# Patient Record
Sex: Male | Born: 1935 | Race: White | Hispanic: No | Smoking: Former smoker
Health system: Southern US, Community
[De-identification: ages and names within clinical notes are randomized; demographics above are authoritative.]

## PROBLEM LIST (undated history)

## (undated) DIAGNOSIS — I639 Cerebral infarction, unspecified: Secondary | ICD-10-CM

## (undated) DIAGNOSIS — F0390 Unspecified dementia without behavioral disturbance: Secondary | ICD-10-CM

## (undated) DIAGNOSIS — G20 Parkinson's disease: Secondary | ICD-10-CM

## (undated) DIAGNOSIS — I509 Heart failure, unspecified: Secondary | ICD-10-CM

---
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
## 1999-10-22 ENCOUNTER — Encounter: Payer: Self-pay | Admitting: Cardiology

## 1999-10-22 ENCOUNTER — Inpatient Hospital Stay (HOSPITAL_COMMUNITY): Admission: EM | Admit: 1999-10-22 | Discharge: 1999-10-23 | Payer: Self-pay | Admitting: Emergency Medicine

## 1999-11-14 ENCOUNTER — Ambulatory Visit (HOSPITAL_COMMUNITY): Admission: RE | Admit: 1999-11-14 | Discharge: 1999-11-14 | Payer: Self-pay | Admitting: Internal Medicine

## 2012-08-16 ENCOUNTER — Encounter: Payer: Self-pay | Admitting: Gastroenterology

## 2013-04-24 ENCOUNTER — Encounter: Payer: Self-pay | Admitting: Gastroenterology

## 2016-02-23 ENCOUNTER — Other Ambulatory Visit: Payer: Self-pay | Admitting: Neurological Surgery

## 2016-02-23 DIAGNOSIS — S12191A Other nondisplaced fracture of second cervical vertebra, initial encounter for closed fracture: Secondary | ICD-10-CM

## 2016-02-28 ENCOUNTER — Ambulatory Visit
Admission: RE | Admit: 2016-02-28 | Discharge: 2016-02-28 | Disposition: A | Discharge status: Home / Self Care | Payer: Medicare Other | Source: Ambulatory Visit | Attending: Neurological Surgery | Admitting: Neurological Surgery

## 2016-02-28 DIAGNOSIS — S12191A Other nondisplaced fracture of second cervical vertebra, initial encounter for closed fracture: Secondary | ICD-10-CM

## 2017-10-30 DIAGNOSIS — F0390 Unspecified dementia without behavioral disturbance: Secondary | ICD-10-CM

## 2017-10-30 DIAGNOSIS — I509 Heart failure, unspecified: Secondary | ICD-10-CM

## 2017-10-30 HISTORY — DX: Unspecified dementia, unspecified severity, without behavioral disturbance, psychotic disturbance, mood disturbance, and anxiety: F03.90

## 2017-10-30 HISTORY — DX: Heart failure, unspecified: I50.9

## 2018-09-05 DIAGNOSIS — I639 Cerebral infarction, unspecified: Secondary | ICD-10-CM | POA: Diagnosis present

## 2018-09-05 HISTORY — DX: Cerebral infarction, unspecified: I63.9

## 2019-03-27 ENCOUNTER — Emergency Department (HOSPITAL_COMMUNITY)
Admission: EM | Admit: 2019-03-27 | Discharge: 2019-03-28 | Disposition: A | Discharge status: Home / Self Care | Payer: Medicare Other | Attending: Emergency Medicine | Admitting: Emergency Medicine

## 2019-03-27 ENCOUNTER — Other Ambulatory Visit: Payer: Self-pay

## 2019-03-27 ENCOUNTER — Encounter (HOSPITAL_COMMUNITY): Payer: Self-pay | Admitting: Emergency Medicine

## 2019-03-27 DIAGNOSIS — F0390 Unspecified dementia without behavioral disturbance: Secondary | ICD-10-CM | POA: Insufficient documentation

## 2019-03-27 DIAGNOSIS — R059 Cough, unspecified: Secondary | ICD-10-CM

## 2019-03-27 DIAGNOSIS — R05 Cough: Secondary | ICD-10-CM

## 2019-03-27 DIAGNOSIS — Z87891 Personal history of nicotine dependence: Secondary | ICD-10-CM | POA: Insufficient documentation

## 2019-03-27 DIAGNOSIS — Z8673 Personal history of transient ischemic attack (TIA), and cerebral infarction without residual deficits: Secondary | ICD-10-CM | POA: Diagnosis not present

## 2019-03-27 DIAGNOSIS — Z20828 Contact with and (suspected) exposure to other viral communicable diseases: Secondary | ICD-10-CM | POA: Insufficient documentation

## 2019-03-27 DIAGNOSIS — R0600 Dyspnea, unspecified: Secondary | ICD-10-CM | POA: Insufficient documentation

## 2019-03-27 NOTE — ED Notes (Signed)
RN spoke with daughter at bedside. Daughter said nursing home has been giving pt breathing treatments lately for cough and SOB.

## 2019-03-27 NOTE — ED Triage Notes (Signed)
Pt presents to ED c/o cough that has worsened over the last few days, hot to touch. Pt is from [facility]. 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

## 2019-03-27 NOTE — ED Notes (Signed)
Frederick Decker, Frederick Decker, daughter, [phone]. Has insurance card and paperwork. And if possible call her if she needs to come back with him. She is here

## 2019-03-28 ENCOUNTER — Emergency Department (HOSPITAL_COMMUNITY): Payer: Medicare Other

## 2019-03-28 DIAGNOSIS — R05 Cough: Secondary | ICD-10-CM | POA: Diagnosis not present

## 2019-03-28 LAB — BASIC METABOLIC PANEL
Anion gap: 14 (ref 5–15)
BUN: 18 mg/dL (ref 8–23)
CO2: 18 mmol/L — ABNORMAL LOW (ref 22–32)
Calcium: 8.9 mg/dL (ref 8.9–10.3)
Chloride: 104 mmol/L (ref 98–111)
Creatinine, Ser: 1.5 mg/dL — ABNORMAL HIGH (ref 0.61–1.24)
GFR calc Af Amer: 49 mL/min — ABNORMAL LOW (ref >60)
GFR calc non Af Amer: 42 mL/min — ABNORMAL LOW (ref >60)
Glucose, Bld: 152 mg/dL — ABNORMAL HIGH (ref 70–99)
Potassium: 3.6 mmol/L (ref 3.5–5.1)
Sodium: 136 mmol/L (ref 135–145)

## 2019-03-28 LAB — CBC WITH DIFFERENTIAL/PLATELET
Abs Immature Granulocytes: 0.15 10*3/uL — ABNORMAL HIGH (ref 0.00–0.07)
Basophils Absolute: 0 10*3/uL (ref 0.0–0.1)
Basophils Relative: 0 %
Eosinophils Absolute: 0.2 10*3/uL (ref 0.0–0.5)
Eosinophils Relative: 2 %
HCT: 33.6 % — ABNORMAL LOW (ref 39.0–52.0)
Hemoglobin: 10.9 g/dL — ABNORMAL LOW (ref 13.0–17.0)
Immature Granulocytes: 2 %
Lymphocytes Relative: 15 %
Lymphs Abs: 1.5 10*3/uL (ref 0.7–4.0)
MCH: 29.2 pg (ref 26.0–34.0)
MCHC: 32.4 g/dL (ref 30.0–36.0)
MCV: 90.1 fL (ref 80.0–100.0)
Monocytes Absolute: 1.1 10*3/uL — ABNORMAL HIGH (ref 0.1–1.0)
Monocytes Relative: 10 %
Neutro Abs: 7.3 10*3/uL (ref 1.7–7.7)
Neutrophils Relative %: 71 %
Platelets: 182 10*3/uL (ref 150–400)
RBC: 3.73 MIL/uL — ABNORMAL LOW (ref 4.22–5.81)
RDW: 13.1 % (ref 11.5–15.5)
WBC: 10.3 10*3/uL (ref 4.0–10.5)
nRBC: 0 % (ref 0.0–0.2)

## 2019-03-28 LAB — BRAIN NATRIURETIC PEPTIDE: B Natriuretic Peptide: 101.9 pg/mL — ABNORMAL HIGH (ref 0.0–100.0)

## 2019-03-28 LAB — TROPONIN I: Troponin I: <0.03 ng/mL (ref <0.03)

## 2019-03-28 LAB — SARS CORONAVIRUS 2 BY RT PCR (HOSPITAL ORDER, PERFORMED IN ~~LOC~~ HOSPITAL LAB): SARS Coronavirus 2: NEGATIVE

## 2019-03-28 NOTE — ED Notes (Signed)
RN called [facility].  

## 2019-03-28 NOTE — ED Provider Notes (Signed)
TIME SEEN: 12:17 AM  CHIEF COMPLAINT: Cough  HPI: Patient is an 85 year old male with history of dementia, stroke with right-sided deficits and aphasia, CHF who presents to the emergency department from [facility] nursing home with concerns for cough and increased work of breathing tonight.  No known fevers.  Nursing home called patient's daughter who asked for them to call 911 to bring him to the hospital.  No known COVID outbreak at this nursing home facility.  Daughter reports increased bilateral lower extremity swelling 4 days.  ROS: Level 5 caveat secondary to dementia and aphasia  PAST MEDICAL HISTORY/PAST SURGICAL HISTORY:  Past Medical History:  Diagnosis Date  . CHF (congestive heart failure) (HCC) 2019  . Dementia (HCC) 2019  . Stroke [facility]) 09/05/2018    MEDICATIONS:  Prior to Admission medications   Not on File    ALLERGIES:  No Known Allergies  SOCIAL HISTORY:  Social History   Tobacco Use  . Smoking status: Former Games developer  . Smokeless tobacco: Never Used  Substance Use Topics  . Alcohol use: Never    Frequency: Never    FAMILY HISTORY: No family history on file.  EXAM: BP 116/62   Pulse 87   Temp 99.5 F (37.5 C) (Rectal)   Resp (!) 24   Ht 5\' 4"  (1.626 m)   Wt 79.4 kg   SpO2 96%   BMI 30.04 kg/m  CONSTITUTIONAL: Alert but unable to answer questions or follow commands.  Elderly, in no distress, afebrile HEAD: Normocephalic EYES: Conjunctivae clear, pupils appear equal, EOMI ENT: normal nose; moist mucous membranes NECK: Supple, no meningismus, no nuchal rigidity, no LAD  CARD: RRR; S1 and S2 appreciated; no murmurs, no clicks, no rubs, no gallops RESP: Normal chest excursion without splinting or tachypnea; breath sounds clear and equal bilaterally; no wheezes, no rhonchi, no rales, no hypoxia or respiratory distress, speaking full sentences ABD/GI: Normal bowel sounds; non-distended; soft, non-tender, no rebound, no guarding, no peritoneal signs, no  hepatosplenomegaly BACK:  The back appears normal and is non-tender to palpation, there is no CVA tenderness EXT: Normal ROM in all joints; non-tender to palpation; mild nonpitting edema in bilateral lower extremities to the mid shin; normal capillary refill; no cyanosis, no calf tenderness or swelling    SKIN: Normal color for age and race; warm; no rash NEURO: 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 Grooming and personal hygiene are appropriate.  MEDICAL DECISION MAKING: Patient here with cough and increased work of breathing at his nursing home tonight.  Afebrile here and no increased work of breathing, respiratory distress, hypoxia.  His lungs are clear.  Will obtain chest x-ray to evaluate for CHF exacerbation versus pneumonia.  Will check labs, EKG and a COVID-19 swab.  Daughter comfortable with this plan.  ED PROGRESS: Patient's work-up here has been unremarkable.  Coronavirus screening negative.  Chest x-ray clear.  No sign of pneumonia or pulmonary edema.  He has not coughing here in the emergency room and he has not had hypoxia or increased work of breathing.  I feel he is safe to go back to his nursing facility and his daughter who is at the bedside agrees.  She will have the physician at the nursing facility reassess the patient tomorrow.  Discussed return precautions.   At this time, I do not feel there is any life-threatening condition present. I have reviewed and discussed all results (EKG, imaging,  lab, urine as appropriate) and exam findings with patient/family. I have reviewed nursing notes and appropriate previous records.  I feel the patient is safe to be discharged home without further emergent workup and can continue workup as an outpatient as needed. Discussed usual and customary return precautions. Patient/family verbalize understanding and are comfortable with this plan.  Outpatient  follow-up has been provided as needed. All questions have been answered.      EKG Interpretation  Date/Time:  Friday Mar 28 2019 01:00:49 EDT Ventricular Rate:  85 PR Interval:    QRS Duration: 99 QT Interval:  382 QTC Calculation: 455 R Axis:   41 Text Interpretation:  Sinus rhythm No significant change since last tracing Confirmed by Ward, Baxter HireKristen [phone] on 03/28/2019 2:00:27 AM          Ward, Layla MawKristen N, DO 03/28/19 [id number]

## 2019-03-28 NOTE — ED Notes (Signed)
Discharge instructions discussed with pt. Pt's family verbalized understanding. Pt stable leaving with [facility].

## 2019-06-05 ENCOUNTER — Emergency Department (HOSPITAL_COMMUNITY): Payer: Medicare Other

## 2019-06-05 ENCOUNTER — Encounter (HOSPITAL_COMMUNITY): Payer: Self-pay | Admitting: Emergency Medicine

## 2019-06-05 ENCOUNTER — Inpatient Hospital Stay (HOSPITAL_COMMUNITY)
Admission: EM | Admit: 2019-06-05 | Discharge: 2019-06-12 | DRG: 177 | Disposition: A | Discharge status: Skilled Nursing Facility | Payer: Medicare Other | Source: Skilled Nursing Facility | Attending: Internal Medicine | Admitting: Internal Medicine

## 2019-06-05 DIAGNOSIS — K769 Liver disease, unspecified: Secondary | ICD-10-CM | POA: Diagnosis present

## 2019-06-05 DIAGNOSIS — R609 Edema, unspecified: Secondary | ICD-10-CM

## 2019-06-05 DIAGNOSIS — F0280 Dementia in other diseases classified elsewhere without behavioral disturbance: Secondary | ICD-10-CM | POA: Diagnosis not present

## 2019-06-05 DIAGNOSIS — N183 Chronic kidney disease, stage 3 unspecified: Secondary | ICD-10-CM | POA: Diagnosis present

## 2019-06-05 DIAGNOSIS — Z79899 Other long term (current) drug therapy: Secondary | ICD-10-CM

## 2019-06-05 DIAGNOSIS — R0602 Shortness of breath: Secondary | ICD-10-CM

## 2019-06-05 DIAGNOSIS — I10 Essential (primary) hypertension: Secondary | ICD-10-CM | POA: Diagnosis not present

## 2019-06-05 DIAGNOSIS — R7989 Other specified abnormal findings of blood chemistry: Secondary | ICD-10-CM | POA: Diagnosis not present

## 2019-06-05 DIAGNOSIS — Z7982 Long term (current) use of aspirin: Secondary | ICD-10-CM | POA: Diagnosis not present

## 2019-06-05 DIAGNOSIS — R0902 Hypoxemia: Secondary | ICD-10-CM

## 2019-06-05 DIAGNOSIS — L89896 Pressure-induced deep tissue damage of other site: Secondary | ICD-10-CM | POA: Diagnosis present

## 2019-06-05 DIAGNOSIS — R778 Other specified abnormalities of plasma proteins: Secondary | ICD-10-CM | POA: Diagnosis present

## 2019-06-05 DIAGNOSIS — Z20828 Contact with and (suspected) exposure to other viral communicable diseases: Secondary | ICD-10-CM | POA: Diagnosis present

## 2019-06-05 DIAGNOSIS — K219 Gastro-esophageal reflux disease without esophagitis: Secondary | ICD-10-CM | POA: Diagnosis not present

## 2019-06-05 DIAGNOSIS — I5033 Acute on chronic diastolic (congestive) heart failure: Secondary | ICD-10-CM | POA: Diagnosis not present

## 2019-06-05 DIAGNOSIS — Z7902 Long term (current) use of antithrombotics/antiplatelets: Secondary | ICD-10-CM

## 2019-06-05 DIAGNOSIS — G20 Parkinson's disease: Secondary | ICD-10-CM | POA: Diagnosis not present

## 2019-06-05 DIAGNOSIS — L8990 Pressure ulcer of unspecified site, unspecified stage: Secondary | ICD-10-CM | POA: Insufficient documentation

## 2019-06-05 DIAGNOSIS — E876 Hypokalemia: Secondary | ICD-10-CM | POA: Diagnosis present

## 2019-06-05 DIAGNOSIS — J9601 Acute respiratory failure with hypoxia: Secondary | ICD-10-CM | POA: Diagnosis present

## 2019-06-05 DIAGNOSIS — I639 Cerebral infarction, unspecified: Secondary | ICD-10-CM | POA: Diagnosis present

## 2019-06-05 DIAGNOSIS — R4182 Altered mental status, unspecified: Secondary | ICD-10-CM | POA: Diagnosis present

## 2019-06-05 DIAGNOSIS — I69351 Hemiplegia and hemiparesis following cerebral infarction affecting right dominant side: Secondary | ICD-10-CM

## 2019-06-05 DIAGNOSIS — R1312 Dysphagia, oropharyngeal phase: Secondary | ICD-10-CM | POA: Diagnosis not present

## 2019-06-05 DIAGNOSIS — Z87891 Personal history of nicotine dependence: Secondary | ICD-10-CM

## 2019-06-05 DIAGNOSIS — G20A1 Parkinson's disease without dyskinesia, without mention of fluctuations: Secondary | ICD-10-CM | POA: Diagnosis present

## 2019-06-05 DIAGNOSIS — I248 Other forms of acute ischemic heart disease: Secondary | ICD-10-CM | POA: Diagnosis not present

## 2019-06-05 DIAGNOSIS — J690 Pneumonitis due to inhalation of food and vomit: Secondary | ICD-10-CM | POA: Diagnosis not present

## 2019-06-05 DIAGNOSIS — I351 Nonrheumatic aortic (valve) insufficiency: Secondary | ICD-10-CM | POA: Diagnosis not present

## 2019-06-05 DIAGNOSIS — Z96653 Presence of artificial knee joint, bilateral: Secondary | ICD-10-CM | POA: Diagnosis present

## 2019-06-05 DIAGNOSIS — I130 Hypertensive heart and chronic kidney disease with heart failure and stage 1 through stage 4 chronic kidney disease, or unspecified chronic kidney disease: Secondary | ICD-10-CM | POA: Diagnosis not present

## 2019-06-05 DIAGNOSIS — T502X5A Adverse effect of carbonic-anhydrase inhibitors, benzothiadiazides and other diuretics, initial encounter: Secondary | ICD-10-CM | POA: Diagnosis not present

## 2019-06-05 DIAGNOSIS — F0390 Unspecified dementia without behavioral disturbance: Secondary | ICD-10-CM | POA: Diagnosis present

## 2019-06-05 HISTORY — DX: Parkinson's disease: G20

## 2019-06-05 LAB — LIPASE, BLOOD: Lipase: 41 U/L (ref 11–51)

## 2019-06-05 LAB — URINALYSIS, ROUTINE W REFLEX MICROSCOPIC
Bilirubin Urine: NEGATIVE
Glucose, UA: NEGATIVE mg/dL
Ketones, ur: NEGATIVE mg/dL
Leukocytes,Ua: NEGATIVE
Nitrite: NEGATIVE
Protein, ur: NEGATIVE mg/dL
Specific Gravity, Urine: 1.01 (ref 1.005–1.030)
pH: 6 (ref 5.0–8.0)

## 2019-06-05 LAB — COMPREHENSIVE METABOLIC PANEL
ALT: 7 U/L (ref 0–44)
AST: 20 U/L (ref 15–41)
Albumin: 3.1 g/dL — ABNORMAL LOW (ref 3.5–5.0)
Alkaline Phosphatase: 62 U/L (ref 38–126)
Anion gap: 8 (ref 5–15)
BUN: 18 mg/dL (ref 8–23)
CO2: 23 mmol/L (ref 22–32)
Calcium: 9 mg/dL (ref 8.9–10.3)
Chloride: 110 mmol/L (ref 98–111)
Creatinine, Ser: 1.3 mg/dL — ABNORMAL HIGH (ref 0.61–1.24)
GFR calc Af Amer: 58 mL/min — ABNORMAL LOW (ref >60)
GFR calc non Af Amer: 50 mL/min — ABNORMAL LOW (ref >60)
Glucose, Bld: 131 mg/dL — ABNORMAL HIGH (ref 70–99)
Potassium: 3.3 mmol/L — ABNORMAL LOW (ref 3.5–5.1)
Sodium: 141 mmol/L (ref 135–145)
Total Bilirubin: 0.6 mg/dL (ref 0.3–1.2)
Total Protein: 6.5 g/dL (ref 6.5–8.1)

## 2019-06-05 LAB — CBC WITH DIFFERENTIAL/PLATELET
Abs Immature Granulocytes: 0.08 10*3/uL — ABNORMAL HIGH (ref 0.00–0.07)
Basophils Absolute: 0 10*3/uL (ref 0.0–0.1)
Basophils Relative: 0 %
Eosinophils Absolute: 0.3 10*3/uL (ref 0.0–0.5)
Eosinophils Relative: 3 %
HCT: 36.3 % — ABNORMAL LOW (ref 39.0–52.0)
Hemoglobin: 11.9 g/dL — ABNORMAL LOW (ref 13.0–17.0)
Immature Granulocytes: 1 %
Lymphocytes Relative: 13 %
Lymphs Abs: 1.2 10*3/uL (ref 0.7–4.0)
MCH: 29.5 pg (ref 26.0–34.0)
MCHC: 32.8 g/dL (ref 30.0–36.0)
MCV: 90.1 fL (ref 80.0–100.0)
Monocytes Absolute: 0.8 10*3/uL (ref 0.1–1.0)
Monocytes Relative: 8 %
Neutro Abs: 7.2 10*3/uL (ref 1.7–7.7)
Neutrophils Relative %: 75 %
Platelets: 245 10*3/uL (ref 150–400)
RBC: 4.03 MIL/uL — ABNORMAL LOW (ref 4.22–5.81)
RDW: 13.2 % (ref 11.5–15.5)
WBC: 9.6 10*3/uL (ref 4.0–10.5)
nRBC: 0 % (ref 0.0–0.2)

## 2019-06-05 LAB — SARS CORONAVIRUS 2 BY RT PCR (HOSPITAL ORDER, PERFORMED IN ~~LOC~~ HOSPITAL LAB): SARS Coronavirus 2: NEGATIVE

## 2019-06-05 LAB — TROPONIN I (HIGH SENSITIVITY)
Troponin I (High Sensitivity): 22 ng/L — ABNORMAL HIGH (ref <18)
Troponin I (High Sensitivity): 21 ng/L — ABNORMAL HIGH (ref <18)
Troponin I (High Sensitivity): 21 ng/L — ABNORMAL HIGH (ref <18)

## 2019-06-05 LAB — BRAIN NATRIURETIC PEPTIDE: B Natriuretic Peptide: 110.5 pg/mL — ABNORMAL HIGH (ref 0.0–100.0)

## 2019-06-05 LAB — PROTIME-INR
INR: 1.1 (ref 0.8–1.2)
Prothrombin Time: 14.2 seconds (ref 11.4–15.2)

## 2019-06-05 LAB — LACTIC ACID, PLASMA
Lactic Acid, Venous: 1.5 mmol/L (ref 0.5–1.9)
Lactic Acid, Venous: 0.9 mmol/L (ref 0.5–1.9)

## 2019-06-05 LAB — AMMONIA: Ammonia: 20 umol/L (ref 9–35)

## 2019-06-05 MED ORDER — IPRATROPIUM BROMIDE 0.02 % IN SOLN
0.5000 mg | Freq: Four times a day (QID) | RESPIRATORY_TRACT | Status: DC
  Administered 2019-06-06 (×2): 0.5 mg via RESPIRATORY_TRACT
  Filled 2019-06-05 (×4): qty 2.5

## 2019-06-05 MED ORDER — POTASSIUM CHLORIDE CRYS ER 20 MEQ PO TBCR
40.0000 meq | EXTENDED_RELEASE_TABLET | Freq: Once | ORAL | Status: AC
  Administered 2019-06-06: 40 meq via ORAL
  Filled 2019-06-05: qty 2

## 2019-06-05 MED ORDER — DOXYCYCLINE HYCLATE 100 MG PO TABS
100.0000 mg | ORAL_TABLET | Freq: Two times a day (BID) | ORAL | Status: DC
  Administered 2019-06-06: 100 mg via ORAL
  Filled 2019-06-05: qty 1

## 2019-06-05 MED ORDER — IOHEXOL 350 MG/ML SOLN
75.0000 mL | Freq: Once | INTRAVENOUS | Status: AC | PRN
  Administered 2019-06-05: 19:00:00 75 mL via INTRAVENOUS

## 2019-06-05 MED ORDER — ENOXAPARIN SODIUM 40 MG/0.4ML ~~LOC~~ SOLN
40.0000 mg | SUBCUTANEOUS | Status: DC
  Administered 2019-06-06 – 2019-06-11 (×7): 40 mg via SUBCUTANEOUS
  Filled 2019-06-05 (×7): qty 0.4

## 2019-06-05 MED ORDER — FUROSEMIDE 10 MG/ML IJ SOLN
40.0000 mg | Freq: Two times a day (BID) | INTRAMUSCULAR | Status: DC
  Administered 2019-06-06 – 2019-06-08 (×5): 40 mg via INTRAVENOUS
  Filled 2019-06-05 (×6): qty 4

## 2019-06-05 MED ORDER — CARBIDOPA-LEVODOPA 25-100 MG PO TABS
1.0000 | ORAL_TABLET | Freq: Three times a day (TID) | ORAL | Status: DC
  Administered 2019-06-06 – 2019-06-12 (×20): 1 via ORAL
  Filled 2019-06-05 (×22): qty 1

## 2019-06-05 MED ORDER — CLOPIDOGREL BISULFATE 75 MG PO TABS
75.0000 mg | ORAL_TABLET | Freq: Every day | ORAL | Status: DC
  Administered 2019-06-06 – 2019-06-12 (×7): 75 mg via ORAL
  Filled 2019-06-05 (×7): qty 1

## 2019-06-05 MED ORDER — STARCH (THICKENING) PO POWD
1.0000 | Freq: Three times a day (TID) | ORAL | Status: DC
  Administered 2019-06-05 – 2019-06-08 (×6): 1 via ORAL
  Filled 2019-06-05: qty 227

## 2019-06-05 MED ORDER — PANTOPRAZOLE SODIUM 40 MG PO TBEC
40.0000 mg | DELAYED_RELEASE_TABLET | Freq: Every day | ORAL | Status: DC
  Administered 2019-06-06 – 2019-06-12 (×7): 40 mg via ORAL
  Filled 2019-06-05 (×7): qty 1

## 2019-06-05 MED ORDER — LORATADINE 10 MG PO TABS
10.0000 mg | ORAL_TABLET | Freq: Every day | ORAL | Status: DC
  Administered 2019-06-06 – 2019-06-12 (×8): 10 mg via ORAL
  Filled 2019-06-05 (×8): qty 1

## 2019-06-05 MED ORDER — BAZA PROTECT EX CREA: 1.0000 "application " | TOPICAL_CREAM | Freq: Two times a day (BID) | CUTANEOUS | Status: DC | PRN

## 2019-06-05 MED ORDER — FUROSEMIDE 10 MG/ML IJ SOLN
40.0000 mg | Freq: Once | INTRAMUSCULAR | Status: AC
  Administered 2019-06-05: 20:00:00 40 mg via INTRAVENOUS
  Filled 2019-06-05: qty 4

## 2019-06-05 MED ORDER — PIPERACILLIN-TAZOBACTAM 3.375 G IVPB
3.3750 g | Freq: Three times a day (TID) | INTRAVENOUS | Status: DC
  Administered 2019-06-06 – 2019-06-09 (×11): 3.375 g via INTRAVENOUS
  Filled 2019-06-05 (×12): qty 50

## 2019-06-05 MED ORDER — ALBUTEROL SULFATE (2.5 MG/3ML) 0.083% IN NEBU: 2.5000 mg | INHALATION_SOLUTION | RESPIRATORY_TRACT | Status: DC | PRN

## 2019-06-05 MED ORDER — VITAMIN D 25 MCG (1000 UNIT) PO TABS
1000.0000 [IU] | ORAL_TABLET | Freq: Every day | ORAL | Status: DC
  Administered 2019-06-06 – 2019-06-12 (×7): 1000 [IU] via ORAL
  Filled 2019-06-05 (×9): qty 1

## 2019-06-05 MED ORDER — FINASTERIDE 5 MG PO TABS
5.0000 mg | ORAL_TABLET | Freq: Every day | ORAL | Status: DC
  Administered 2019-06-06 – 2019-06-12 (×7): 5 mg via ORAL
  Filled 2019-06-05 (×7): qty 1

## 2019-06-05 MED ORDER — AMLODIPINE BESYLATE 10 MG PO TABS
10.0000 mg | ORAL_TABLET | Freq: Every day | ORAL | Status: DC
  Administered 2019-06-06 – 2019-06-12 (×7): 10 mg via ORAL
  Filled 2019-06-05 (×7): qty 1

## 2019-06-05 MED ORDER — DM-GUAIFENESIN ER 30-600 MG PO TB12
1.0000 | ORAL_TABLET | Freq: Two times a day (BID) | ORAL | Status: DC | PRN
  Administered 2019-06-11: 1 via ORAL
  Filled 2019-06-05: qty 1

## 2019-06-05 MED ORDER — ASPIRIN EC 81 MG PO TBEC
81.0000 mg | DELAYED_RELEASE_TABLET | Freq: Every day | ORAL | Status: DC
  Administered 2019-06-06 – 2019-06-12 (×7): 81 mg via ORAL
  Filled 2019-06-05 (×7): qty 1

## 2019-06-05 MED ORDER — TRAZODONE HCL 50 MG PO TABS
25.0000 mg | ORAL_TABLET | Freq: Every day | ORAL | Status: DC
  Administered 2019-06-06 – 2019-06-11 (×7): 25 mg via ORAL
  Filled 2019-06-05 (×7): qty 1

## 2019-06-05 NOTE — ED Notes (Signed)
Admitting provider at the bedside.

## 2019-06-05 NOTE — ED Provider Notes (Signed)
  Physical Exam  BP 134/89   Pulse 84   Temp 99 F (37.2 C) (Rectal)   Resp 13   SpO2 100%   Physical Exam  ED Course/Procedures     Procedures  MDM  Patient sent from nursing home with hypoxia.  Sats in the 80s there.  Sats have been better here however has a fair amount of edema on his legs and abdomen.  Facility cannot do oxygen.  I think the fact with sats went all the way down to the 80s patient benefit from admission to the hospital for diuresis and further evaluation.  BNP mildly elevated although x-ray did not show CHF.  Had been treated with doxycycline for pneumonia but no pneumonia on x-ray either.  Will admit for further work-up.  Will diurese here.  CTA done due to relative immobility and shortness of breath/hypoxia.  Does not show PE within limits of the somewhat degraded test but does show potential infiltrate in right midlung field.  Will admit to unassigned medicine for       Davonna Belling, MD 06/05/19 [place]

## 2019-06-05 NOTE — ED Triage Notes (Signed)
Pt here from nursing home with c/o ascites and swelling to legs new for him , pt was recently dx with PNA last sat and is currently on antibiotics  Pt with  history of stroke and normally can do yes and no questions but non verbal today

## 2019-06-05 NOTE — ED Notes (Signed)
Patient transported to CT 

## 2019-06-05 NOTE — Progress Notes (Signed)
Pharmacy Antibiotic Note  Frederick Decker is a 85 y.o. male admitted on 06/05/2019 with possible aspiration pneumonia. Was recently treated with doxycycline outpt for pneumonia. CXR show potential infiltrates in right midlung field. Pharmacy has been consulted for pip-tazo dosing.  Afebrile, Tmax 99. WBC wnl 9.6. Scr 1.3, near baseline.   Plan: Zosyn 3.375 gm IV q8h F/U cxs, clinical improvement, and renal fxn  Weight: 180 lb (81.6 kg)(Family reported)  Temp (24hrs), Avg:99 F (37.2 C), Min:99 F (37.2 C), Max:99 F (37.2 C)  Recent Labs  Lab 06/05/19 1210 06/05/19 1240 06/05/19 1410  WBC 9.6  --   --   CREATININE 1.30*  --   --   LATICACIDVEN  --  1.5 0.9    Estimated Creatinine Clearance: 40.1 mL/min (A) (by C-G formula based on SCr of 1.3 mg/dL (H)).    No Known Allergies  Antimicrobials this admission: 8/6 Zosyn >>  Microbiology results: 8/6 BCx: sent 8/6 UCx: sent  Thank you for allowing pharmacy to be a part of this patient's care.  Mirian Capuchin, 06/05/2019 8:36 PM

## 2019-06-05 NOTE — H&P (Signed)
History and Physical    Frederick AlbinoBill Devilla ZOX:[id number] DOB: December 25, 1930 DOA: 06/05/2019  Referring MD/NP/PA:   PCP: System, Pcp Not In   Patient coming from:  The patient is coming from SNF.  At baseline, pt is dependent for most of ADL.        Chief Complaint: SOB  HPI: Frederick Decker is a 85 y.o. male with medical history significant of dementia, hypertension, stroke with right-sided weakness, dCHF, Parkinson's disease, CKD stage III, GERD, who presents with shortness of breath.  Per patient's daughter, patient was noted to have shortness of breath and dry cough in the facility today.  He also has mild fever and chills.  He was found to have oxygen desaturation to upper 80s in facility.  Daughter does not think patient has any chest pain.  No active nausea, vomiting, diarrhea or abdominal pain.  No symptoms of UTI.  Does not seem to have abdominal pain per her daughter.  At baseline, patient is not oriented x3. He can only say simple words such as "yes", basically not talking.  Per her daughter, patient's mental status seems to be close to his baseline. Of note, pt has treated with doxycycline for possible pneumonia since 7/31. Pt has bilateral leg edema spreading up to abdomen.  ED Course: pt was found to have WBC 9.6, negative COVID-19 test, ammonia level 20, potassium 3.3, BMP 110, troponin XX 1, 22, INR 1.1, lactic acid 0.9, renal function close to baseline, temperature 99, blood pressure 141/83, heart rate 87, respiration rate 24, 15, current oxygen saturation 96% on room air, chest x-ray negative.  CT angiogram is a limited study, but did not show central PE, it also showed few areas of patchy ground-glass opacity in the right middle lobe, lingula and lower lobes with more consolidative opacity in the right posterior costophrenic sulcus. Pt is admitted to tele bed as inpt  Review of Systems: Could not be reviewed due to dementia  Allergy: No Known Allergies  Past Medical History:  Diagnosis  Date   CHF (congestive heart failure) (HCC) 2019   Dementia (HCC) 2019   Parkinson disease (HCC)    Stroke (HCC) 09/05/2018    Past Surgical History:  Procedure Laterality Date   BACK SURGERY     REPLACEMENT TOTAL KNEE BILATERAL      Social History:  reports that he has quit smoking. He has never used smokeless tobacco. He reports that he does not drink alcohol or use drugs.  Family History: No family history on file.   Prior to Admission medications   Medication Sig Start Date End Date Taking? Authorizing Provider  acetaminophen (TYLENOL) 325 MG tablet Take 650 mg by mouth every 6 (six) hours as needed.   Yes [provider]  albuterol (PROVENTIL) (2.5 MG/3ML) 0.083% nebulizer solution Inhale 2.5 mLs into the lungs every 4 (four) hours as needed. 02/22/19  Yes [provider]  amLODipine (NORVASC) 10 MG tablet Take 10 mg by mouth daily.   Yes [provider]  aspirin EC 81 MG tablet Take 81 mg by mouth daily.   Yes [provider]  bumetanide (BUMEX) 0.5 MG tablet Take 0.5 mg by mouth daily. 03/06/19  Yes [provider]  carbidopa-levodopa (SINEMET IR) 25-100 MG tablet Take 1 tablet by mouth 3 (three) times daily.   Yes [provider]  cetirizine (ZYRTEC) 10 MG tablet Take 10 mg by mouth daily.   Yes [provider]  Cholecalciferol (D 1000) 25 MCG (1000 UT)  capsule Take 1,000 Units by mouth daily.   Yes [provider]  clopidogrel (PLAVIX) 75 MG tablet Take 75 mg by mouth daily. 03/03/19  Yes [provider]  Cyanocobalamin (VITAMIN B 12 PO) Take 1,000 mcg by mouth daily.   Yes [provider]  doxycycline (VIBRA-TABS) 100 MG tablet Take 100 mg by mouth 2 (two) times daily.   Yes [provider]  Ensure (ENSURE) Take 237 mLs by mouth daily. Keep in the frig   Yes [provider]  finasteride (PROSCAR) 5 MG tablet Take 5 mg by mouth daily. 03/03/19  Yes [provider]  food thickener (THICK IT) POWD Take 1 Container by mouth 3 (three) times daily. With liquids   Yes [provider]  omeprazole (PRILOSEC) 20 MG capsule Take 20 mg by mouth daily. 03/21/19  Yes [provider]  potassium chloride (K-DUR) 10 MEQ tablet Take 10 mEq by mouth 2 (two) times daily.   Yes [provider]  Skin Protectants, Misc. (DIMETHICONE-ZINC OXIDE) cream Apply 1 application topically 2 (two) times daily as needed (skin Incontinence care).   Yes [provider]  traZODone (DESYREL) 50 MG tablet Take 25 mg by mouth at bedtime. 03/03/19  Yes [provider]    Physical Exam: Vitals:   06/05/19 1730 06/05/19 1900 06/05/19 2000 06/05/19 2129  BP: 134/89 (!) 141/83  129/82  Pulse: 84 87  74  Resp:  15  17  Temp:    98.2 F (36.8 C)  TempSrc:    Oral  SpO2: 100% 99%  95%  Weight:   81.6 kg    General: Not in acute distress HEENT:       Eyes: PERRL, EOMI, no scleral icterus.       ENT: No discharge from the ears and nose, no pharynx injection, no tonsillar enlargement.        Neck: positive JVD, no bruit, no mass felt. Heme: No neck lymph node enlargement. Cardiac: S1/S2, RRR, No murmurs, No gallops or rubs. Respiratory: has fine crackles at base and rhonchi bilaterally. GI: Soft, nondistended, nontender, no rebound pain, no organomegaly, BS present. Has abdominal wall swelling GU: No hematuria Ext: 3+ pitting leg edema bilaterally. 2+DP/PT pulse bilaterally. Musculoskeletal: No joint deformities, No joint redness or warmth, no limitation of ROM in spin. Skin: No rashes.  Neuro: Nonverbal, not oriented X3, cranial nerves II-XII grossly intact, has right sided weakness from previous stroke. t. Psych: Patient is not psychotic, no suicidal or hemocidal ideation.  Labs on Admission: I have personally reviewed following labs and imaging studies  CBC: Recent Labs  Lab 06/05/19 1210  WBC 9.6  NEUTROABS 7.2  HGB 11.9*  HCT  36.3*  MCV 90.1  PLT 245   Basic Metabolic Panel: Recent Labs  Lab 06/05/19 1210  NA 141  K 3.3*  CL 110  CO2 23  GLUCOSE 131*  BUN 18  CREATININE 1.30*  CALCIUM 9.0   GFR: Estimated Creatinine Clearance: 40.1 mL/min (A) (by C-G formula based on SCr of 1.3 mg/dL (H)). Liver Function Tests: Recent Labs  Lab 06/05/19 1210  AST 20  ALT 7  ALKPHOS 62  BILITOT 0.6  PROT 6.5  ALBUMIN 3.1*   Recent Labs  Lab 06/05/19 1210  LIPASE 41   Recent Labs  Lab 06/05/19 1240  AMMONIA 20   Coagulation Profile: Recent Labs  Lab 06/05/19 1210  INR 1.1   Cardiac Enzymes: No results for input(s): CKTOTAL, CKMB, CKMBINDEX, TROPONINI  in the last 168 hours. BNP (last 3 results) No results for input(s): PROBNP in the last 8760 hours. HbA1C: No results for input(s): HGBA1C in the last 72 hours. CBG: No results for input(s): GLUCAP in the last 168 hours. Lipid Profile: No results for input(s): CHOL, HDL, LDLCALC, TRIG, CHOLHDL, LDLDIRECT in the last 72 hours. Thyroid Function Tests: No results for input(s): TSH, T4TOTAL, FREET4, T3FREE, THYROIDAB in the last 72 hours. Anemia Panel: No results for input(s): VITAMINB12, FOLATE, FERRITIN, TIBC, IRON, RETICCTPCT in the last 72 hours. Urine analysis:    Component Value Date/Time   COLORURINE STRAW (A) 06/05/2019 1410   APPEARANCEUR HAZY (A) 06/05/2019 1410   LABSPEC 1.010 06/05/2019 1410   PHURINE 6.0 06/05/2019 1410   GLUCOSEU NEGATIVE 06/05/2019 1410   HGBUR SMALL (A) 06/05/2019 1410   BILIRUBINUR NEGATIVE 06/05/2019 1410   KETONESUR NEGATIVE 06/05/2019 1410   PROTEINUR NEGATIVE 06/05/2019 1410   NITRITE NEGATIVE 06/05/2019 1410   LEUKOCYTESUR NEGATIVE 06/05/2019 1410   Sepsis Labs: @LABRCNTIP (procalcitonin:4,lacticidven:4) ) Recent Results (from the past 240 hour(s))  SARS Coronavirus 2 [facility] order, Performed in [facility] hospital lab) Nasopharyngeal Nasopharyngeal Swab     Status: None   Collection Time:  06/05/19 12:30 PM   Specimen: Nasopharyngeal Swab  Result Value Ref Range Status   SARS Coronavirus 2 NEGATIVE NEGATIVE Final    Comment: (NOTE) If result is NEGATIVE SARS-CoV-2 target nucleic acids are NOT DETECTED. The SARS-CoV-2 RNA is generally detectable in upper and lower  respiratory specimens during the acute phase of infection. The lowest  concentration of SARS-CoV-2 viral copies this assay can detect is 250  copies / mL. A negative result does not preclude SARS-CoV-2 infection  and should not be used as the sole basis for treatment or other  patient management decisions.  A negative result may occur with  improper specimen collection / handling, submission of specimen other  than nasopharyngeal swab, presence of viral mutation(s) within the  areas targeted by this assay, and inadequate number of viral copies  (<250 copies / mL). A negative result must be combined with clinical  observations, patient history, and epidemiological information. If result is POSITIVE SARS-CoV-2 target nucleic acids are DETECTED. The SARS-CoV-2 RNA is generally detectable in upper and lower  respiratory specimens dur ing the acute phase of infection.  Positive  results are indicative of active infection with SARS-CoV-2.  Clinical  correlation with patient history and other diagnostic information is  necessary to determine patient infection status.  Positive results do  not rule out bacterial infection or co-infection with other viruses. If result is PRESUMPTIVE POSTIVE SARS-CoV-2 nucleic acids MAY BE PRESENT.   A presumptive positive result was obtained on the submitted specimen  and confirmed on repeat testing.  While 2019 novel coronavirus  (SARS-CoV-2) nucleic acids may be present in the submitted sample  additional confirmatory testing may be necessary for epidemiological  and / or clinical management purposes  to differentiate between  SARS-CoV-2 and other Sarbecovirus currently known to  infect humans.  If clinically indicated additional testing with an alternate test  methodology [phone]) is advised. The SARS-CoV-2 RNA is generally  detectable in upper and lower respiratory sp ecimens during the acute  phase of infection. The expected result is Negative. Fact Sheet for Patients:  BoilerBrush.com.cy Fact Sheet for Healthcare Providers: https://pope.com/ This test is not yet approved or cleared by the [place] FDA and has been authorized for detection and/or diagnosis of SARS-CoV-2 by FDA under an Emergency  Use Authorization (EUA).  This EUA will remain in effect (meaning this test can be used) for the duration of the COVID-19 declaration under Section 564(b)(1) of the Act, 21 U.S.C. section 360bbb-3(b)(1), unless the authorization is terminated or revoked sooner. Performed at [facility], [address] [place], [place] [address]      Radiological Exams on Admission: Ct Head Wo Contrast  Result Date: 06/05/2019 CLINICAL DATA:  Altered mental status. EXAM: CT HEAD WITHOUT CONTRAST TECHNIQUE: Contiguous axial images were obtained from the base of the skull through the vertex without intravenous contrast. COMPARISON:  Head CT and MRI 09/05/2018 FINDINGS: Brain: There is no evidence of acute large territory infarct, intracranial hemorrhage, mass, midline shift, or extra-axial fluid collection. Confluent cerebral white matter hypodensities are similar to the prior CT and nonspecific but compatible with severe chronic small vessel ischemic disease. Moderate ventriculomegaly is unchanged and is again noted to be out of proportion to the degree of sulcal enlargement with gyral crowding at the vertex. A chronic distal left ACA infarct is noted in the posterior left frontal lobe. There are chronic lacunar infarcts in the thalami. Heterogeneous hypoattenuation is also present in the basal ganglia bilaterally with numerous  dilated perivascular spaces shown on MRI. There is a small chronic left cerebellar infarct which is unchanged. Vascular: Calcified atherosclerosis at the skull base. No hyperdense vessel. Skull: No acute fracture or focal osseous lesion. Sinuses/Orbits: No acute findings.  Bilateral cataract extraction. Other: None. IMPRESSION: 1. No evidence of acute intracranial abnormality. 2. Severe chronic small vessel ischemic disease with multiple chronic infarcts as above. 3. Unchanged ventriculomegaly which may reflect central predominant cerebral atrophy or normal pressure hydrocephalus in the appropriate clinical setting. Electronically Signed   By: Sebastian AcheAllen  Grady M.D.   On: 06/05/2019 15:33   Ct Angio Chest Pe W And/or Wo Contrast  Result Date: 06/05/2019 CLINICAL DATA:  PE suspected, high pretest probability, presents with ascites and swelling to legs, new for this patient. Recent pneumonia currently receiving antibiotic therapy. EXAM: CT ANGIOGRAPHY CHEST WITH CONTRAST TECHNIQUE: Multidetector CT imaging of the chest was performed using the standard protocol during bolus administration of intravenous contrast. Multiplanar CT image reconstructions and MIPs were obtained to evaluate the vascular anatomy. CONTRAST:  75mL OMNIPAQUE IOHEXOL 350 MG/ML SOLN COMPARISON:  Chest radiograph same day FINDINGS: Cardiovascular: Satisfactory opacification of the pulmonary arteries to the segmental level. Extensive respiratory motion artifact may limit detection of distal segmental and subsegmental pulmonary emboli. Detection is further complicated by extensive streak artifact from patient's thoracic hardware. No evidence of large central or segmental pulmonary embolus. The heart is enlarged with extensive coronary artery calcification, mitral annular calcification and calcifications upon the aortic leaflets. No pericardial effusion. Mediastinum/Nodes: No enlarged mediastinal, hilar, or axillary lymph nodes. Thyroid gland, trachea,  and esophagus demonstrate no significant findings. Lungs/Pleura: Dependent atelectasis is seen posteriorly. Few areas of patchy ground-glass opacity are present in the right middle lobe, lingula and lung bases which may reflect infectious or inflammatory sequela from known pneumonia. More consolidative opacity is present in the right posterior costophrenic sulcus. There is abundant subpleural fat. No pneumothorax. No visible effusion. Upper Abdomen: 2.2 cm hypoattenuating lesion is seen in segment 4 of the liver, incompletely assessed on this exam due to the single phase of contrast extensive streak artifact from the patient's thoracolumbar hardware. No acute abnormalities present in the visualized portions of the upper abdomen. Musculoskeletal: Cervicothoracic and thoracolumbar fusion hardware is present within the spine there is evidence  of adjacent segment disease near the terminus of both fixations. Findings on a background of more diffuse degenerative disease. Mild bilateral gynecomastia is present. No acute or suspicious osseous lesion or soft tissue abnormality is seen. Review of the MIP images confirms the above findings. IMPRESSION: 1. Extensive respiratory motion artifact may limit detection of distal segmental and subsegmental pulmonary emboli. No evidence of large central or segmental pulmonary embolus. 2. Few areas of patchy ground-glass opacity are present in the right middle lobe, lingula and lower lobes with more consolidative opacity in the right posterior costophrenic sulcus may reflect infectious or inflammatory sequela from known pneumonia and/or features of atelectasis. 3. Cardiomegaly with extensive coronary artery calcification, mitral annular calcification, and calcifications upon the aortic leaflets. 4. Aortic Atherosclerosis (ICD10-I70.0). 5. Indeterminate 2.2 cm hypoattenuating lesion in liver. Incompletely characterized given streak artifact in single phase of imaging. Could be further  evaluated with sonography on an outpatient basis. Electronically Signed   By: Kreg Shropshire M.D.   On: 06/05/2019 19:14   Dg Chest Portable 1 View  Result Date: 06/05/2019 CLINICAL DATA:  Shortness of breath, hypoxia EXAM: PORTABLE CHEST 1 VIEW COMPARISON:  None. FINDINGS: No consolidation, features of edema, pneumothorax, or effusion. Chronic elevation the right hemidiaphragm pulmonary vascularity is normally distributed. Calcified tortuous aorta the cardiomediastinal contours are otherwise unremarkable. No acute osseous or soft tissue abnormality. Degenerative changes are present in the and imaged spine and shoulders. Partially imaged cervical and thoracolumbar fusion hardware without visible complication. Surgical material projects in the right upper quadrant. IMPRESSION: No acute cardiopulmonary abnormality Electronically Signed   By: Kreg Shropshire M.D.   On: 06/05/2019 17:15     EKG: Independently reviewed.  Sinus rhythm, QTC 443, low voltage (poor quality of EKG strip)  Assessment/Plan Principal Problem:   Acute respiratory failure with hypoxia (HCC) Active Problems:   Acute on chronic diastolic CHF (congestive heart failure) (HCC)   Parkinson disease (HCC)   Stroke (HCC)   Dementia (HCC)   Aspiration pneumonia (HCC)   HTN (hypertension)   GERD (gastroesophageal reflux disease)   Hypokalemia   Liver lesion   Elevated troponin   CKD (chronic kidney disease), stage III (HCC)   Acute respiratory failure with hypoxia (HCC): Likely due to the combination of CHF exacerbation and possible aspiration pneumonia.  Patient has 3+ leg edema, spreading to abdomen, positive JVD, clinically consistent with CHF exacerbation.  Patient is currently treated with doxycycline for possible pneumonia.  CT angiogram is negative for central PE, but showed few areas of patchy ground-glass opacity in the right middle lobe, lingula and lower lobes with more consolidative opacity in the right posterior costophrenic  sulcus.  Given history of Parkinson's disease, is likely that the patient has aspiration pneumonia.  -will admit to telemetry bed as inpatient -cannula oxygen to maintain oxygen saturation above 93% -Bronchodilators -Treat aspiration pneumonia and CHF as below  Aspiration pneumonia: - keep pt NPO and get SLP - Start Zosyn and continue doxycycline - Mucinex for cough  - Atrovent and prn albuterol - Urine legionella and S. pneumococcal antigen - Follow up blood culture x2, sputum culture.  Acute on chronic diastolic CHF (congestive heart failure) (HCC): 2D echo 09/06/2018 showed EF 60-65%. -Lasix 40 mg bid by IV -2d echo -Daily weights -strict I/O's -Low salt diet -Fluid restriction  Elevated troponin: Trop 22. No CP.  Likely due to demand ischemia secondary to hypoxia and CHF exacerbation --Trend Trop - Repeat EKG in the am  - aspirin -  Risk factor stratification: will check FLP and A1C   Parkinson disease ([place]): -Continue Sinemet  Stroke [facility]): -Continue aspirin, Plavix  Dementia ([place]): No behavior change. -Observe mental status closely  Essential hypertension: -IV Hydralazine prn -Continue home medications: Amlodipine  GERD (gastroesophageal reflux disease): -Protonix  Hypokalemia: -Repleted -Check magnesium level  Liver lesion: CAT showed  Indeterminate 2.2 cm hypoattenuating lesion in liver. Incompletely characterized given streak artifact in single phase of imaging. -need to f/u with PCP  CKD (chronic kidney disease), stage III ([place]): Stable.  Recent baseline creatinine 1.50 on 03/27/2019.  His creatinine is 1.3, BUN 18. Follow-up renal function by [facility]        Inpatient status:  # Patient requires inpatient status due to high intensity of service, high risk for further deterioration and high frequency of surveillance required.  I certify that at the point of admission it is my clinical judgment that the patient will require inpatient hospital care  spanning beyond 2 midnights from the point of admission.   This patient has multiple chronic comorbidities including  dementia, hypertension, stroke with right-sided weakness, dCHF, Parkinson's disease, CKD stage III, GERD  Now patient has presenting with acute respiratory failure with hypoxia due to combination of CHF exacerbation and possible aspiration pneumonia.  The worrisome physical exam findings include fine crackles at base and rhonchi bilaterally on auscultation, 3+ leg edema, positive JVD,  The initial radiographic and laboratory data are worrisome because of right-sided lung infiltration by CT angiogram, elevated troponin, hypokalemia  Current medical needs: please see my assessment and plan  Predictability of an adverse outcome (risk): Patient's multiple comorbidities, now presents with acute respiratory failure with hypoxia due to combination of CHF exacerbation and possible aspiration pneumonia.  His presentation is highly complicated.  Given his old age and multiple comorbidities, patient is at high risk for deteriorating.  Will need to be treated in the hospital for at least 2 days.     DVT ppx:  SQ Lovenox Code Status: Partial code (I discussed with her daughter, and explained the meaning of CODE STATUS, patient wants to be partial code per her daugher, OK for CPR, but no intubation). Family Communication: Yes, patient's daughter at bed side Disposition Plan:  Anticipate discharge back to previous SNF Consults called:  none Admission status:  Inpatient/tele   Date of Service 06/06/2019    [place] [facility]   If 7PM-7AM, please contact night-coverage [email or web address] Password [email or web address] 06/06/2019, 12:47 AM

## 2019-06-05 NOTE — ED Provider Notes (Signed)
[facility] Provider Note   CSN: [id number] Arrival date & time: 06/05/19  1206     History   Chief Complaint No chief complaint on file.   HPI Frederick AlbinoBill Pellman is a 85 y.o. male.     LVL 5 caveat for Dementia, prior stroke, and AMS  The history is provided by medical records and the EMS personnel. The history is limited by the condition of the patient. No language interpreter was used.  Illness Location:  Peripheral edema Severity:  Severe Onset quality:  Gradual Duration:  2 days Timing:  Constant Progression:  Unchanged Chronicity:  New   Past Medical History:  Diagnosis Date  . CHF (congestive heart failure) (HCC) 2019  . Dementia (HCC) 2019  . Stroke (HCC) 09/05/2018    There are no active problems to display for this patient.   Past Surgical History:  Procedure Laterality Date  . BACK SURGERY    . REPLACEMENT TOTAL KNEE BILATERAL          Home Medications    Prior to Admission medications   Not on File    Family History No family history on file.  Social History Social History   Tobacco Use  . Smoking status: Former Games developermoker  . Smokeless tobacco: Never Used  Substance Use Topics  . Alcohol use: Never    Frequency: Never  . Drug use: Never     Allergies   Patient has no known allergies.   Review of Systems Review of Systems  Unable to perform ROS: Dementia     Physical Exam Updated Vital Signs There were no vitals taken for this visit.  Physical Exam Vitals signs and nursing note reviewed.  Constitutional:      General: He is not in acute distress.    Appearance: He is not ill-appearing, toxic-appearing or diaphoretic.  HENT:     Head: Normocephalic.     Nose: No congestion or rhinorrhea.     Mouth/Throat:     Mouth: Mucous membranes are moist.     Pharynx: No oropharyngeal exudate or posterior oropharyngeal erythema.  Eyes:     Extraocular Movements: Extraocular movements intact.    Conjunctiva/sclera: Conjunctivae normal.     Pupils: Pupils are equal, round, and reactive to light.  Neck:     Musculoskeletal: No muscular tenderness.  Cardiovascular:     Rate and Rhythm: Normal rate.     Pulses: Normal pulses.  Pulmonary:     Effort: Pulmonary effort is normal. No respiratory distress.     Breath sounds: No stridor. Rhonchi and rales present. No wheezing.  Chest:     Chest wall: No tenderness.  Abdominal:     General: Abdomen is flat. There is distension.     Tenderness: There is no abdominal tenderness.  Musculoskeletal:        General: No tenderness.     Right lower leg: Edema present.     Left lower leg: Edema present.  Skin:    General: Skin is warm.     Capillary Refill: Capillary refill takes less than 2 seconds.     Findings: No rash.  Neurological:     Mental Status: Mental status is at baseline.     Comments: Nonverbal  Not following commands.       ED Treatments / Results  Labs (all labs ordered are listed, but only abnormal results are displayed) Labs Reviewed  CBC WITH DIFFERENTIAL/PLATELET - Abnormal; Notable for the following components:  Result Value   RBC 4.03 (*)    Hemoglobin 11.9 (*)    HCT 36.3 (*)    Abs Immature Granulocytes 0.08 (*)    All other components within normal limits  COMPREHENSIVE METABOLIC PANEL - Abnormal; Notable for the following components:   Potassium 3.3 (*)    Glucose, Bld 131 (*)    Creatinine, Ser 1.30 (*)    Albumin 3.1 (*)    GFR calc non Af Amer 50 (*)    GFR calc Af Amer 58 (*)    All other components within normal limits  BRAIN NATRIURETIC PEPTIDE - Abnormal; Notable for the following components:   B Natriuretic Peptide 110.5 (*)    All other components within normal limits  URINALYSIS, ROUTINE W REFLEX MICROSCOPIC - Abnormal; Notable for the following components:   Color, Urine STRAW (*)    APPearance HAZY (*)    Hgb urine dipstick SMALL (*)    Bacteria, UA RARE (*)    All other  components within normal limits  TROPONIN I (HIGH SENSITIVITY) - Abnormal; Notable for the following components:   Troponin I (High Sensitivity) 22 (*)    All other components within normal limits  TROPONIN I (HIGH SENSITIVITY) - Abnormal; Notable for the following components:   Troponin I (High Sensitivity) 21 (*)    All other components within normal limits  SARS CORONAVIRUS 2 (HOSPITAL ORDER, [place] [facility])  CULTURE, BLOOD (ROUTINE X 2)  CULTURE, BLOOD (ROUTINE X 2)  URINE CULTURE  LIPASE, BLOOD  LACTIC ACID, PLASMA  LACTIC ACID, PLASMA  PROTIME-INR  AMMONIA    EKG EKG Interpretation  Date/Time:  Thursday June 05 2019 12:14:33 EDT Ventricular Rate:  76 PR Interval:    QRS Duration: 94 QT Interval:  394 QTC Calculation: 443 R Axis:   11 Text Interpretation:  Sinus rhythm Inferior infarct, age indeterminate Baseline wander in lead(s) V2 When compared to prior, more artifact.  No STEMI Confirmed by Antony Blackbird [phone]) on 06/05/2019 12:35:13 PM   Radiology Ct Head Wo Contrast  Result Date: 06/05/2019 CLINICAL DATA:  Altered mental status. EXAM: CT HEAD WITHOUT CONTRAST TECHNIQUE: Contiguous axial images were obtained from the base of the skull through the vertex without intravenous contrast. COMPARISON:  Head CT and MRI 09/05/2018 FINDINGS: Brain: There is no evidence of acute large territory infarct, intracranial hemorrhage, mass, midline shift, or extra-axial fluid collection. Confluent cerebral white matter hypodensities are similar to the prior CT and nonspecific but compatible with severe chronic small vessel ischemic disease. Moderate ventriculomegaly is unchanged and is again noted to be out of proportion to the degree of sulcal enlargement with gyral crowding at the vertex. A chronic distal left ACA infarct is noted in the posterior left frontal lobe. There are chronic lacunar infarcts in the thalami. Heterogeneous hypoattenuation is also present in  the basal ganglia bilaterally with numerous dilated perivascular spaces shown on MRI. There is a small chronic left cerebellar infarct which is unchanged. Vascular: Calcified atherosclerosis at the skull base. No hyperdense vessel. Skull: No acute fracture or focal osseous lesion. Sinuses/Orbits: No acute findings.  Bilateral cataract extraction. Other: None. IMPRESSION: 1. No evidence of acute intracranial abnormality. 2. Severe chronic small vessel ischemic disease with multiple chronic infarcts as above. 3. Unchanged ventriculomegaly which may reflect central predominant cerebral atrophy or normal pressure hydrocephalus in the appropriate clinical setting. Electronically Signed   By: Logan Bores M.D.   On: 06/05/2019 15:33    Procedures  Procedures (including critical care time)  Medications Ordered in ED Medications - No data to display   Initial Impression / Assessment and Plan / ED Course  I have reviewed the triage vital signs and the nursing notes.  Pertinent labs & imaging results that were available during my care of the patient were reviewed by me and considered in my medical decision making (see chart for details).        Frederick AlbinoBill Lutes is a 85 y.o. male with a past medical history significant for dementia, prior stroke, documentation for congestive heart failure, and recent diagnosis of pneumonia on doxycycline who presents with worsening peripheral edema, abdominal distention, decreased mental status, decreased appetite.  According to EMS, patient is at his baseline mental status currently but has had times where he is less interactive.  Patient is not answering questions on my initial evaluation.  Patient reportedly has never had significant peripheral or abdominal swelling and over the last 2 days has developed this rapidly.  Patient was diagnosed with pneumonia several days ago and started on antibiotics and is currently on doxycycline.  He is on room air on arrival.  On exam,  patient has rhonchi and rales in his lungs bilaterally.  Chest is nontender.  Abdomen is distended and nontender.  Legs are extremely edematous bilaterally.  Patient has pulses in all extremities.  Patient is unable to follow commands or speak to me.  According to EMS, patient uses able to do one-word answers at times due to his dementia and stroke.  Clinical concern patient has developed worsened heart failure or kidney failure with the marketed peripheral edema.  Patient will have work-up to look for organ failure as well as chest x-ray for worse pneumonia.  Will check urine.  We will also get a CT of the head given his reported decreased mental status in the setting of prior stroke.  Anticipate reassessment after work-up.   3:25 PM Daughter was able to provide further information.  Daughter reports that patient was having difficulty breathing this morning and oxygen saturations in the 80s.  As he does not use oxygen at his facility, patient was sent in for evaluation.  Patient's troponin was elevated but unchanging.  BNP was similar to prior but elevated.  Given the patient's body habitus and profound edema on exam, I am concerned that this value may be artificially low.  I am concerned about fluid overload in the setting of his pneumonia as well.  Lactic acid was not elevated, doubt sepsis at this time.  Urinalysis does not show convincing evidence of infection.  After CT head and chest x-ray are completed, patient will likely require admission due to the worsening fluid overload and current pneumonia causing hypoxia.  Care transferred to oncoming team while awaiting results of diagnostic work-up.  Anticipate admission.   Final Clinical Impressions(s) / ED Diagnoses   Final diagnoses:  Shortness of breath  Hypoxia  Peripheral edema     Clinical Impression: 1. Shortness of breath   2. Hypoxia   3. Peripheral edema     Disposition: Awaiting results of chest x-ray and CT head,  anticipate admission for fluid overload, creased work of breathing, and hypoxia.  This note was prepared with assistance of Conservation officer, historic buildingsDragon voice recognition software. Occasional wrong-word or sound-a-like substitutions may have occurred due to the inherent limitations of voice recognition software.      , Canary Brimhristopher J, MD 06/05/19 [phone]

## 2019-06-05 NOTE — ED Notes (Signed)
ED TO INPATIENT HANDOFF REPORT  ED Nurse Name and Phone #: [phone]  S Name/Age/Gender Frederick Decker 85 y.o. male Room/Bed: RESUSC/RESUSC  Code Status   Code Status: Not on file  Home/SNF/Other Skilled nursing facility Patient oriented to: self Is this baseline? Yes   Triage Complete: Triage complete  Chief Complaint Pneumonia  Triage Note Pt here from nursing home with c/o ascites and swelling to legs new for him , pt was recently dx with PNA last sat and is currently on antibiotics  Pt with  history of stroke and normally can do yes and no questions but non verbal today    Allergies No Known Allergies  Level of Care/Admitting Diagnosis ED Disposition    ED Disposition Condition Comment   Admit  [facility] [[id number]]  Level of Care: Telemetry Medical [104]  Covid Evaluation: Confirmed COVID Negative  Diagnosis: Aspiration pneumonia [facility](HCC) [[id number]]) [[id number]]  Admitting Physician: Lorretta HarpNIU, XILIN [[id number]]  Attending Physician: Lorretta HarpNIU, XILIN [phone][[id number]]  Estimated length of stay: past midnight tomorrow  Certification:: I certify this patient will need inpatient services for at least 2 midnights  PT Class (Do Not Modify): Inpatient [101]  PT Acc Code (Do Not Modify): Private [1]       B Medical/Surgery History Past Medical History:  Diagnosis Date  . CHF (congestive heart failure) (HCC) 2019  . Dementia (HCC) 2019  . Parkinson disease (HCC)   . Stroke [facility](HCC) 09/05/2018   Past Surgical History:  Procedure Laterality Date  . BACK SURGERY    . REPLACEMENT TOTAL KNEE BILATERAL       A IV Location/Drains/Wounds Patient Lines/Drains/Airways Status   Active Line/Drains/Airways    Name:   Placement date:   Placement time:   Site:   Days:   Peripheral IV 06/05/19 Left Forearm   06/05/19    1212    Forearm   less than 1          Intake/Output Last 24 hours No intake or output data in the 24 hours ending 06/05/19 2053  Labs/Imaging Results for orders  placed or performed during the hospital encounter of 06/05/19 (from the past 48 hour(s))  CBC with Differential     Status: Abnormal   Collection Time: 06/05/19 12:10 PM  Result Value Ref Range   WBC 9.6 4.0 - 10.5 K/uL   RBC 4.03 (L) 4.22 - 5.81 MIL/uL   Hemoglobin 11.9 (L) 13.0 - 17.0 g/dL   HCT 47.836.3 (L) 29.539.0 - 62.152.0 %   MCV 90.1 80.0 - 100.0 fL   MCH 29.5 26.0 - 34.0 pg   MCHC 32.8 30.0 - 36.0 g/dL   RDW 30.813.2 65.711.5 - 84.615.5 %   Platelets 245 150 - 400 K/uL   nRBC 0.0 0.0 - 0.2 %   Neutrophils Relative % 75 %   Neutro Abs 7.2 1.7 - 7.7 K/uL   Lymphocytes Relative 13 %   Lymphs Abs 1.2 0.7 - 4.0 K/uL   Monocytes Relative 8 %   Monocytes Absolute 0.8 0.1 - 1.0 K/uL   Eosinophils Relative 3 %   Eosinophils Absolute 0.3 0.0 - 0.5 K/uL   Basophils Relative 0 %   Basophils Absolute 0.0 0.0 - 0.1 K/uL   Immature Granulocytes 1 %   Abs Immature Granulocytes 0.08 (H) 0.00 - 0.07 K/uL    Comment: Performed at [facility], [address] [place], [place] [address]  Comprehensive metabolic panel     Status: Abnormal   Collection  Time: 06/05/19 12:10 PM  Result Value Ref Range   Sodium 141 135 - 145 mmol/L   Potassium 3.3 (L) 3.5 - 5.1 mmol/L   Chloride 110 98 - 111 mmol/L   CO2 23 22 - 32 mmol/L   Glucose, Bld 131 (H) 70 - 99 mg/dL   BUN 18 8 - 23 mg/dL   Creatinine, Ser 1.61 (H) 0.61 - 1.24 mg/dL   Calcium 9.0 8.9 - 09.6 mg/dL   Total Protein 6.5 6.5 - 8.1 g/dL   Albumin 3.1 (L) 3.5 - 5.0 g/dL   AST 20 15 - 41 U/L   ALT 7 0 - 44 U/L   Alkaline Phosphatase 62 38 - 126 U/L   Total Bilirubin 0.6 0.3 - 1.2 mg/dL   GFR calc non Af Amer 50 (L) >60 mL/min   GFR calc Af Amer 58 (L) >60 mL/min   Anion gap 8 5 - 15    Comment: Performed at [facility], [address] [place], [place] [address]  Lipase, blood     Status: None   Collection Time: 06/05/19 12:10 PM  Result Value Ref Range   Lipase 41 11 - 51 U/L    Comment: Performed at [facility], [address], [place], [place] [address]  Protime-INR     Status: None   Collection Time: 06/05/19 12:10 PM  Result Value Ref Range   Prothrombin Time 14.2 11.4 - 15.2 seconds   INR 1.1 0.8 - 1.2    Comment: (NOTE) INR goal varies based on device and disease states. Performed at [facility], [address], [place], [place] [address]   Troponin I (High Sensitivity)     Status: Abnormal   Collection Time: 06/05/19 12:10 PM  Result Value Ref Range   Troponin I (High Sensitivity) 22 (H) <18 ng/L    Comment: (NOTE) Elevated high sensitivity troponin I (hsTnI) values and significant  changes across serial measurements may suggest ACS but many other  chronic and acute conditions are known to elevate hsTnI results.  Refer to the "Links" section for chest pain algorithms and additional  guidance. Performed at [facility], [address], [place], [place] [address]   SARS Coronavirus 2 [facility] order, Performed in [facility] hospital lab) Nasopharyngeal Nasopharyngeal Swab     Status: None   Collection Time: 06/05/19 12:30 PM   Specimen: Nasopharyngeal Swab  Result Value Ref Range   SARS Coronavirus 2 NEGATIVE NEGATIVE    Comment: (NOTE) If result is NEGATIVE SARS-CoV-2 target nucleic acids are NOT DETECTED. The SARS-CoV-2 RNA is generally detectable in upper and lower  respiratory specimens during the acute phase of infection. The lowest  concentration of SARS-CoV-2 viral copies this assay can detect is 250  copies / mL. A negative result does not preclude SARS-CoV-2 infection  and should not be used as the sole basis for treatment or other  patient management decisions.  A negative result may occur with  improper specimen collection / handling, submission of specimen other  than nasopharyngeal swab, presence of viral mutation(s) within the  areas targeted by this assay, and inadequate number of viral copies  (<250 copies / mL). A negative result must be combined with clinical   observations, patient history, and epidemiological information. If result is POSITIVE SARS-CoV-2 target nucleic acids are DETECTED. The SARS-CoV-2 RNA is generally detectable in upper and lower  respiratory specimens dur ing the acute phase of infection.  Positive  results are indicative of active infection with SARS-CoV-2.  Clinical  correlation with patient history and other diagnostic information is  necessary to determine patient infection status.  Positive results do  not rule out bacterial infection or co-infection with other viruses. If result is PRESUMPTIVE POSTIVE SARS-CoV-2 nucleic acids MAY BE PRESENT.   A presumptive positive result was obtained on the submitted specimen  and confirmed on repeat testing.  While 2019 novel coronavirus  (SARS-CoV-2) nucleic acids may be present in the submitted sample  additional confirmatory testing may be necessary for epidemiological  and / or clinical management purposes  to differentiate between  SARS-CoV-2 and other Sarbecovirus currently known to infect humans.  If clinically indicated additional testing with an alternate test  methodology [phone]([id number]) is advised. The SARS-CoV-2 RNA is generally  detectable in upper and lower respiratory sp ecimens during the acute  phase of infection. The expected result is Negative. Fact Sheet for Patients:  BoilerBrush.com.cyhttps://www.fda.gov/media/136312/download Fact Sheet for Healthcare Providers: https://pope.com/https://www.fda.gov/media/136313/download This test is not yet approved or cleared by the [place] FDA and has been authorized for detection and/or diagnosis of SARS-CoV-2 by FDA under an Emergency Use Authorization (EUA).  This EUA will remain in effect (meaning this test can be used) for the duration of the COVID-19 declaration under Section 564(b)(1) of the Act, 21 U.S.C. section 360bbb-3(b)(1), unless the authorization is terminated or revoked sooner. Performed at [facility], [address]  [place], [place] [address]   Lactic acid, plasma     Status: None   Collection Time: 06/05/19 12:40 PM  Result Value Ref Range   Lactic Acid, Venous 1.5 0.5 - 1.9 mmol/L    Comment: Performed at [facility], [address] [place], [place] [address]  Ammonia     Status: None   Collection Time: 06/05/19 12:40 PM  Result Value Ref Range   Ammonia 20 9 - 35 umol/L    Comment: Performed at [facility], [address] [place], [place] [address]  Brain natriuretic peptide     Status: Abnormal   Collection Time: 06/05/19 12:40 PM  Result Value Ref Range   B Natriuretic Peptide 110.5 (H) 0.0 - 100.0 pg/mL    Comment: Performed at [facility], [address] [place], [place] [address]  Lactic acid, plasma     Status: None   Collection Time: 06/05/19  2:10 PM  Result Value Ref Range   Lactic Acid, Venous 0.9 0.5 - 1.9 mmol/L    Comment: Performed at [facility], [address] [place], [place] [address]  Urinalysis, Routine w reflex microscopic     Status: Abnormal   Collection Time: 06/05/19  2:10 PM  Result Value Ref Range   Color, Urine STRAW (A) YELLOW   APPearance HAZY (A) CLEAR   Specific Gravity, Urine 1.010 1.005 - 1.030   pH 6.0 5.0 - 8.0   Glucose, UA NEGATIVE NEGATIVE mg/dL   Hgb urine dipstick SMALL (A) NEGATIVE   Bilirubin Urine NEGATIVE NEGATIVE   Ketones, ur NEGATIVE NEGATIVE mg/dL   Protein, ur NEGATIVE NEGATIVE mg/dL   Nitrite NEGATIVE NEGATIVE   Leukocytes,Ua NEGATIVE NEGATIVE   RBC / HPF 11-20 0 - 5 RBC/hpf   WBC, UA 0-5 0 - 5 WBC/hpf   Bacteria, UA RARE (A) NONE SEEN   Squamous Epithelial / LPF 0-5 0 - 5   Mucus PRESENT    Hyaline Casts, UA PRESENT     Comment:  Performed at [facility], [address] [place], [place] [address]  Troponin I (High Sensitivity)     Status: Abnormal   Collection Time: 06/05/19  2:10 PM  Result Value Ref Range   Troponin I (High Sensitivity) 21 (H) <18 ng/L    Comment: (NOTE) Elevated high  sensitivity troponin I (hsTnI) values and significant  changes across serial measurements may suggest ACS but many other  chronic and acute conditions are known to elevate hsTnI results.  Refer to the "Links" section for chest pain algorithms and additional  guidance. Performed at [facility], [address] [place], [place] [address]    Ct Head Wo Contrast  Result Date: 06/05/2019 CLINICAL DATA:  Altered mental status. EXAM: CT HEAD WITHOUT CONTRAST TECHNIQUE: Contiguous axial images were obtained from the base of the skull through the vertex without intravenous contrast. COMPARISON:  Head CT and MRI 09/05/2018 FINDINGS: Brain: There is no evidence of acute large territory infarct, intracranial hemorrhage, mass, midline shift, or extra-axial fluid collection. Confluent cerebral white matter hypodensities are similar to the prior CT and nonspecific but compatible with severe chronic small vessel ischemic disease. Moderate ventriculomegaly is unchanged and is again noted to be out of proportion to the degree of sulcal enlargement with gyral crowding at the vertex. A chronic distal left ACA infarct is noted in the posterior left frontal lobe. There are chronic lacunar infarcts in the thalami. Heterogeneous hypoattenuation is also present in the basal ganglia bilaterally with numerous dilated perivascular spaces shown on MRI. There is a small chronic left cerebellar infarct which is unchanged. Vascular: Calcified atherosclerosis at the skull base. No hyperdense vessel. Skull: No acute fracture or focal osseous lesion. Sinuses/Orbits: No acute findings.  Bilateral cataract extraction. Other: None. IMPRESSION: 1. No evidence of acute intracranial abnormality. 2. Severe chronic small vessel ischemic disease with multiple chronic infarcts as above. 3. Unchanged ventriculomegaly which may reflect central predominant cerebral atrophy or normal pressure hydrocephalus in the appropriate clinical setting.  Electronically Signed   By: Sebastian AcheAllen  Grady M.D.   On: 06/05/2019 15:33   Ct Angio Chest Pe W And/or Wo Contrast  Result Date: 06/05/2019 CLINICAL DATA:  PE suspected, high pretest probability, presents with ascites and swelling to legs, new for this patient. Recent pneumonia currently receiving antibiotic therapy. EXAM: CT ANGIOGRAPHY CHEST WITH CONTRAST TECHNIQUE: Multidetector CT imaging of the chest was performed using the standard protocol during bolus administration of intravenous contrast. Multiplanar CT image reconstructions and MIPs were obtained to evaluate the vascular anatomy. CONTRAST:  75mL OMNIPAQUE IOHEXOL 350 MG/ML SOLN COMPARISON:  Chest radiograph same day FINDINGS: Cardiovascular: Satisfactory opacification of the pulmonary arteries to the segmental level. Extensive respiratory motion artifact may limit detection of distal segmental and subsegmental pulmonary emboli. Detection is further complicated by extensive streak artifact from patient's thoracic hardware. No evidence of large central or segmental pulmonary embolus. The heart is enlarged with extensive coronary artery calcification, mitral annular calcification and calcifications upon the aortic leaflets. No pericardial effusion. Mediastinum/Nodes: No enlarged mediastinal, hilar, or axillary lymph nodes. Thyroid gland, trachea, and esophagus demonstrate no significant findings. Lungs/Pleura: Dependent atelectasis is seen posteriorly. Few areas of patchy ground-glass opacity are present in the right middle lobe, lingula and lung bases which may reflect infectious or inflammatory sequela from known pneumonia. More consolidative opacity is present in the right posterior costophrenic sulcus. There is abundant subpleural fat. No pneumothorax. No visible effusion. Upper Abdomen: 2.2 cm hypoattenuating lesion is seen in segment  4 of the liver, incompletely assessed on this exam due to the single phase of contrast extensive streak artifact from the  patient's thoracolumbar hardware. No acute abnormalities present in the visualized portions of the upper abdomen. Musculoskeletal: Cervicothoracic and thoracolumbar fusion hardware is present within the spine there is evidence of adjacent segment disease near the terminus of both fixations. Findings on a background of more diffuse degenerative disease. Mild bilateral gynecomastia is present. No acute or suspicious osseous lesion or soft tissue abnormality is seen. Review of the MIP images confirms the above findings. IMPRESSION: 1. Extensive respiratory motion artifact may limit detection of distal segmental and subsegmental pulmonary emboli. No evidence of large central or segmental pulmonary embolus. 2. Few areas of patchy ground-glass opacity are present in the right middle lobe, lingula and lower lobes with more consolidative opacity in the right posterior costophrenic sulcus may reflect infectious or inflammatory sequela from known pneumonia and/or features of atelectasis. 3. Cardiomegaly with extensive coronary artery calcification, mitral annular calcification, and calcifications upon the aortic leaflets. 4. Aortic Atherosclerosis (ICD10-I70.0). 5. Indeterminate 2.2 cm hypoattenuating lesion in liver. Incompletely characterized given streak artifact in single phase of imaging. Could be further evaluated with sonography on an outpatient basis. Electronically Signed   By: Lovena Le M.D.   On: 06/05/2019 19:14   Dg Chest Portable 1 View  Result Date: 06/05/2019 CLINICAL DATA:  Shortness of breath, hypoxia EXAM: PORTABLE CHEST 1 VIEW COMPARISON:  None. FINDINGS: No consolidation, features of edema, pneumothorax, or effusion. Chronic elevation the right hemidiaphragm pulmonary vascularity is normally distributed. Calcified tortuous aorta the cardiomediastinal contours are otherwise unremarkable. No acute osseous or soft tissue abnormality. Degenerative changes are present in the and imaged spine and  shoulders. Partially imaged cervical and thoracolumbar fusion hardware without visible complication. Surgical material projects in the right upper quadrant. IMPRESSION: No acute cardiopulmonary abnormality Electronically Signed   By: Lovena Le M.D.   On: 06/05/2019 17:15    Pending Labs Unresulted Labs (From admission, onward)    Start     Ordered   06/06/19 0500  Creatinine, serum  Daily,   R     06/05/19 2043   06/05/19 1210  Blood culture (routine x 2)  BLOOD CULTURE X 2,   STAT     06/05/19 1211   06/05/19 1210  Urine culture  ONCE - STAT,   STAT     06/05/19 1211   Signed and Held  Magnesium  Tomorrow morning,   R     Signed and Held   Signed and Held  Hemoglobin A1c  Tomorrow morning,   R     Signed and Held   Signed and Held  Lipid panel  Tomorrow morning,   R    Comments: Please obtain as a fasting lipid panel - should not have eaten/ drank food for 8 hours prior to labs.    Signed and Held   Signed and Held  Legionella Pneumophila Serogp 1 Ur Ag  Once,   R     Signed and Held   Signed and Held  Strep pneumoniae urinary antigen  Once,   R     Signed and Held          Vitals/Pain Today's Vitals   06/05/19 1730 06/05/19 1900 06/05/19 1949 06/05/19 2000  BP: 134/89 (!) 141/83    Pulse: 84 87    Resp:  15    Temp:      TempSrc:  SpO2: 100% 99%    Weight:    81.6 kg  PainSc:   0-No pain     Isolation Precautions No active isolations  Medications Medications  piperacillin-tazobactam (ZOSYN) IVPB 3.375 g (has no administration in time range)  iohexol (OMNIPAQUE) 350 MG/ML injection 75 mL (75 mLs Intravenous Contrast Given 06/05/19 1845)  furosemide (LASIX) injection 40 mg (40 mg Intravenous Given 06/05/19 1941)    Mobility manual wheelchair High fall risk   Focused Assessments Pulmonary Assessment Handoff:  Lung sounds:   O2 Device: Room Air        R Recommendations: See Admitting Provider Note  Report given to:   Additional Notes: pt lives  at [place], [place], pt daughter says that he does speak sometimes, but confused, baseline

## 2019-06-06 ENCOUNTER — Inpatient Hospital Stay (HOSPITAL_COMMUNITY): Payer: Medicare Other

## 2019-06-06 ENCOUNTER — Other Ambulatory Visit: Payer: Self-pay

## 2019-06-06 DIAGNOSIS — I351 Nonrheumatic aortic (valve) insufficiency: Secondary | ICD-10-CM

## 2019-06-06 DIAGNOSIS — L8990 Pressure ulcer of unspecified site, unspecified stage: Secondary | ICD-10-CM | POA: Insufficient documentation

## 2019-06-06 LAB — ECHOCARDIOGRAM COMPLETE: Weight: 2880 oz

## 2019-06-06 LAB — STREP PNEUMONIAE URINARY ANTIGEN: Strep Pneumo Urinary Antigen: NEGATIVE

## 2019-06-06 LAB — CREATININE, SERUM
Creatinine, Ser: 1.28 mg/dL — ABNORMAL HIGH (ref 0.61–1.24)
GFR calc Af Amer: 59 mL/min — ABNORMAL LOW (ref >60)
GFR calc non Af Amer: 51 mL/min — ABNORMAL LOW (ref >60)

## 2019-06-06 LAB — MRSA PCR SCREENING: MRSA by PCR: NEGATIVE

## 2019-06-06 LAB — URINE CULTURE

## 2019-06-06 LAB — MAGNESIUM: Magnesium: 1.8 mg/dL (ref 1.7–2.4)

## 2019-06-06 LAB — LIPID PANEL
Cholesterol: 119 mg/dL (ref 0–200)
HDL: 33 mg/dL — ABNORMAL LOW (ref >40)
LDL Cholesterol: 70 mg/dL (ref 0–99)
Total CHOL/HDL Ratio: 3.6 RATIO
Triglycerides: 78 mg/dL (ref <150)
VLDL: 16 mg/dL (ref 0–40)

## 2019-06-06 LAB — HEMOGLOBIN A1C
Hgb A1c MFr Bld: 6.1 % — ABNORMAL HIGH (ref 4.8–5.6)
Mean Plasma Glucose: 128.37 mg/dL

## 2019-06-06 LAB — TROPONIN I (HIGH SENSITIVITY): Troponin I (High Sensitivity): 19 ng/L — ABNORMAL HIGH (ref <18)

## 2019-06-06 MED ORDER — HALOPERIDOL LACTATE 5 MG/ML IJ SOLN: 2.0000 mg | Freq: Four times a day (QID) | INTRAMUSCULAR | Status: DC | PRN

## 2019-06-06 MED ORDER — IPRATROPIUM BROMIDE 0.02 % IN SOLN
0.5000 mg | Freq: Two times a day (BID) | RESPIRATORY_TRACT | Status: DC
  Administered 2019-06-07 – 2019-06-08 (×4): 0.5 mg via RESPIRATORY_TRACT
  Filled 2019-06-06 (×4): qty 2.5

## 2019-06-06 NOTE — Progress Notes (Signed)
PROGRESS NOTE    Frederick Decker  WRU:[id number] DOB: 1930/11/23 DOA: 06/05/2019 PCP: System, Pcp Not In  Brief Narrative:HPI: Frederick Decker is a 85 y.o. male with medical history history of dementia, stroke with right-sided weakness, Parkinson's disease, diastolic CHF, stage III chronic kidney disease presented to the emergency room with shortness of breath cough. -Patient at baseline requires assistance in most ADLs, is not able to ambulate without assistance, says only a few words like yes or no. -Family reports that he has gone downhill since his stroke in November. -Now admitted with aspiration pneumonia and acute on chronic diastolic CHF   Assessment & Plan:   Acute respiratory failure with hypoxia ([place]): -Secondary to aspiration pneumonia and acute on chronic diastolic CHF -Continue Zosyn, discontinue doxycycline -IV Lasix, see below -Follow-up blood cultures -SLP evaluation  Aspiration pneumonia -Suspect he has dysphagia from Parkinson's disease, advanced age, prior CVA -Check SLP evaluation today -Continue IV Zosyn -Follow-up blood cultures -Continue supportive care  Acute on chronic diastolic CHF (congestive heart failure) ([place]): - 2D echo 09/06/2018 showed EF 60-65%. -Continue IV Lasix 40 mg twice daily -Repeat echo ordered will follow-up -Monitor I/os, daily weights  Elevated troponin: Trop 22.  Likely due to demand ischemia secondary to hypoxia and CHF exacerbation -No further work-up indicated for this, patient denies any chest pain  Parkinson disease ([place]): -Continue Sinemet  Stroke [facility]): -Continue aspirin, Plavix  Dementia ([place]):  -At baseline can say yes or no and occasionally a sentence, intermittent confusion at baseline -Requires assistance in most ADLs  Essential hypertension: -IV Hydralazine prn -Continue home medications: Amlodipine  GERD (gastroesophageal reflux disease): -Protonix  Hypokalemia: -Repleted -Check magnesium level  Liver  lesion: CAT showed  Indeterminate 2.2 cm hypoattenuating lesion in liver. Incompletely characterized given streak artifact in single phase of imaging. -need to f/u with PCP  CKD (chronic kidney disease), stage III ([place]): Stable.  Recent baseline creatinine 1.50 on 03/27/2019.  His creatinine is 1.3, BUN 18. Follow-up renal function by BMP   DVT ppx:  SQ Lovenox Code Status:  Family request :partial code, wants CPR but no intubation, strongly recommended DNR, she daughter will discuss with other siblings Family Communication: Yes, patient's daughter at bed side Disposition Plan:   May need SNF Consults called:  none  Consultants:      Procedures:   Antimicrobials:    Subjective: -Pleasant, laying in bed, no distress, intermittent cough  Objective: Vitals:   06/05/19 2000 06/05/19 2129 06/06/19 0030 06/06/19 0744  BP:  129/82 131/80 139/71  Pulse:  74 72 68  Resp:  17 16 16   Temp:  98.2 F (36.8 C) 98.4 F (36.9 C) 98.2 F (36.8 C)  TempSrc:  Oral Oral Oral  SpO2:  95% 94% 96%  Weight: 81.6 kg       Intake/Output Summary (Last 24 hours) at 06/06/2019 0941 Last data filed at 06/06/2019 0520 Gross per 24 hour  Intake 0 ml  Output 1500 ml  Net -1500 ml   Filed Weights   06/05/19 2000  Weight: 81.6 kg    Examination:   General exam: Awake, unable to assess orientation, appears somewhat alert, did not communicate, no distress HEENT: Pupils reactive, oral mucosa moist Lungs: Rhonchi at right base, decreased breath sounds at both bases Cardiovascular system: S1 & S2 heard, RRR.  Gastrointestinal system: Abdomen is nondistended, soft and nontender.Normal bowel sounds heard. Central nervous system: Right hemiplegia, awake, unable to assess orientation Extremities: 1-2+ edema Psychiatry: Poor insight and judgment  Data Reviewed:   CBC: Recent Labs  Lab 06/05/19 1210  WBC 9.6  NEUTROABS 7.2  HGB 11.9*  HCT 36.3*  MCV 90.1  PLT 245   Basic Metabolic  Panel: Recent Labs  Lab 06/05/19 1210 06/06/19 0212  NA 141  --   K 3.3*  --   CL 110  --   CO2 23  --   GLUCOSE 131*  --   BUN 18  --   CREATININE 1.30* 1.28*  CALCIUM 9.0  --   MG  --  1.8   GFR: Estimated Creatinine Clearance: 40.7 mL/min (A) (by C-G formula based on SCr of 1.28 mg/dL (H)). Liver Function Tests: Recent Labs  Lab 06/05/19 1210  AST 20  ALT 7  ALKPHOS 62  BILITOT 0.6  PROT 6.5  ALBUMIN 3.1*   Recent Labs  Lab 06/05/19 1210  LIPASE 41   Recent Labs  Lab 06/05/19 1240  AMMONIA 20   Coagulation Profile: Recent Labs  Lab 06/05/19 1210  INR 1.1   Cardiac Enzymes: No results for input(s): CKTOTAL, CKMB, CKMBINDEX, TROPONINI in the last 168 hours. BNP (last 3 results) No results for input(s): PROBNP in the last 8760 hours. HbA1C: Recent Labs    06/06/19 0212  HGBA1C 6.1*   CBG: No results for input(s): GLUCAP in the last 168 hours. Lipid Profile: Recent Labs    06/06/19 0212  CHOL 119  HDL 33*  LDLCALC 70  TRIG 78  CHOLHDL 3.6   Thyroid Function Tests: No results for input(s): TSH, T4TOTAL, FREET4, T3FREE, THYROIDAB in the last 72 hours. Anemia Panel: No results for input(s): VITAMINB12, FOLATE, FERRITIN, TIBC, IRON, RETICCTPCT in the last 72 hours. Urine analysis:    Component Value Date/Time   COLORURINE STRAW (A) 06/05/2019 1410   APPEARANCEUR HAZY (A) 06/05/2019 1410   LABSPEC 1.010 06/05/2019 1410   PHURINE 6.0 06/05/2019 1410   GLUCOSEU NEGATIVE 06/05/2019 1410   HGBUR SMALL (A) 06/05/2019 1410   BILIRUBINUR NEGATIVE 06/05/2019 1410   KETONESUR NEGATIVE 06/05/2019 1410   PROTEINUR NEGATIVE 06/05/2019 1410   NITRITE NEGATIVE 06/05/2019 1410   LEUKOCYTESUR NEGATIVE 06/05/2019 1410   Sepsis Labs: @LABRCNTIP (procalcitonin:4,lacticidven:4)  ) Recent Results (from the past 240 hour(s))  SARS Coronavirus 2 [facility] order, Performed in [facility] hospital lab) Nasopharyngeal Nasopharyngeal Swab     Status: None    Collection Time: 06/05/19 12:30 PM   Specimen: Nasopharyngeal Swab  Result Value Ref Range Status   SARS Coronavirus 2 NEGATIVE NEGATIVE Final    Comment: (NOTE) If result is NEGATIVE SARS-CoV-2 target nucleic acids are NOT DETECTED. The SARS-CoV-2 RNA is generally detectable in upper and lower  respiratory specimens during the acute phase of infection. The lowest  concentration of SARS-CoV-2 viral copies this assay can detect is 250  copies / mL. A negative result does not preclude SARS-CoV-2 infection  and should not be used as the sole basis for treatment or other  patient management decisions.  A negative result may occur with  improper specimen collection / handling, submission of specimen other  than nasopharyngeal swab, presence of viral mutation(s) within the  areas targeted by this assay, and inadequate number of viral copies  (<250 copies / mL). A negative result must be combined with clinical  observations, patient history, and epidemiological information. If result is POSITIVE SARS-CoV-2 target nucleic acids are DETECTED. The SARS-CoV-2 RNA is generally detectable in upper and lower  respiratory specimens dur ing the acute phase of infection.  Positive  results are indicative of active infection with SARS-CoV-2.  Clinical  correlation with patient history and other diagnostic information is  necessary to determine patient infection status.  Positive results do  not rule out bacterial infection or co-infection with other viruses. If result is PRESUMPTIVE POSTIVE SARS-CoV-2 nucleic acids MAY BE PRESENT.   A presumptive positive result was obtained on the submitted specimen  and confirmed on repeat testing.  While 2019 novel coronavirus  (SARS-CoV-2) nucleic acids may be present in the submitted sample  additional confirmatory testing may be necessary for epidemiological  and / or clinical management purposes  to differentiate between  SARS-CoV-2 and other Sarbecovirus  currently known to infect humans.  If clinically indicated additional testing with an alternate test  methodology [phone]([id number]) is advised. The SARS-CoV-2 RNA is generally  detectable in upper and lower respiratory sp ecimens during the acute  phase of infection. The expected result is Negative. Fact Sheet for Patients:  BoilerBrush.com.cyhttps://www.fda.gov/media/136312/download Fact Sheet for Healthcare Providers: https://pope.com/https://www.fda.gov/media/136313/download This test is not yet approved or cleared by the [place] FDA and has been authorized for detection and/or diagnosis of SARS-CoV-2 by FDA under an Emergency Use Authorization (EUA).  This EUA will remain in effect (meaning this test can be used) for the duration of the COVID-19 declaration under Section 564(b)(1) of the Act, 21 U.S.C. section 360bbb-3(b)(1), unless the authorization is terminated or revoked sooner. Performed at [facility], [address] [place], [place] [address]   MRSA PCR Screening     Status: None   Collection Time: 06/06/19  6:36 AM   Specimen: Nasopharyngeal  Result Value Ref Range Status   MRSA by PCR NEGATIVE NEGATIVE Final    Comment:        The GeneXpert MRSA Assay (FDA approved for NASAL specimens only), is one component of a comprehensive MRSA colonization surveillance program. It is not intended to diagnose MRSA infection nor to guide or monitor treatment for MRSA infections. Performed at [facility], [address] [place], [place] [address]          Radiology Studies: Ct Head Wo Contrast  Result Date: 06/05/2019 CLINICAL DATA:  Altered mental status. EXAM: CT HEAD WITHOUT CONTRAST TECHNIQUE: Contiguous axial images were obtained from the base of the skull through the vertex without intravenous contrast. COMPARISON:  Head CT and MRI 09/05/2018 FINDINGS: Brain: There is no evidence of acute large territory infarct, intracranial hemorrhage, mass, midline shift, or extra-axial fluid  collection. Confluent cerebral white matter hypodensities are similar to the prior CT and nonspecific but compatible with severe chronic small vessel ischemic disease. Moderate ventriculomegaly is unchanged and is again noted to be out of proportion to the degree of sulcal enlargement with gyral crowding at the vertex. A chronic distal left ACA infarct is noted in the posterior left frontal lobe. There are chronic lacunar infarcts in the thalami. Heterogeneous hypoattenuation is also present in the basal ganglia bilaterally with numerous dilated perivascular spaces shown on MRI. There is a small chronic left cerebellar infarct which is unchanged. Vascular: Calcified atherosclerosis at the skull base. No hyperdense vessel. Skull: No acute fracture or focal osseous lesion. Sinuses/Orbits: No acute findings.  Bilateral cataract extraction. Other: None. IMPRESSION: 1. No evidence of acute intracranial abnormality. 2. Severe chronic small vessel ischemic disease with multiple chronic infarcts as above. 3. Unchanged ventriculomegaly which may reflect central predominant cerebral atrophy or normal pressure hydrocephalus in the appropriate clinical setting. Electronically Signed   By: Freida BusmanAllen  Mosetta PuttGrady M.D.   On: 06/05/2019 15:33   Ct Angio Chest Pe W And/or Wo Contrast  Result Date: 06/05/2019 CLINICAL DATA:  PE suspected, high pretest probability, presents with ascites and swelling to legs, new for this patient. Recent pneumonia currently receiving antibiotic therapy. EXAM: CT ANGIOGRAPHY CHEST WITH CONTRAST TECHNIQUE: Multidetector CT imaging of the chest was performed using the standard protocol during bolus administration of intravenous contrast. Multiplanar CT image reconstructions and MIPs were obtained to evaluate the vascular anatomy. CONTRAST:  75mL OMNIPAQUE IOHEXOL 350 MG/ML SOLN COMPARISON:  Chest radiograph same day FINDINGS: Cardiovascular: Satisfactory opacification of the pulmonary arteries to the segmental  level. Extensive respiratory motion artifact may limit detection of distal segmental and subsegmental pulmonary emboli. Detection is further complicated by extensive streak artifact from patient's thoracic hardware. No evidence of large central or segmental pulmonary embolus. The heart is enlarged with extensive coronary artery calcification, mitral annular calcification and calcifications upon the aortic leaflets. No pericardial effusion. Mediastinum/Nodes: No enlarged mediastinal, hilar, or axillary lymph nodes. Thyroid gland, trachea, and esophagus demonstrate no significant findings. Lungs/Pleura: Dependent atelectasis is seen posteriorly. Few areas of patchy ground-glass opacity are present in the right middle lobe, lingula and lung bases which may reflect infectious or inflammatory sequela from known pneumonia. More consolidative opacity is present in the right posterior costophrenic sulcus. There is abundant subpleural fat. No pneumothorax. No visible effusion. Upper Abdomen: 2.2 cm hypoattenuating lesion is seen in segment 4 of the liver, incompletely assessed on this exam due to the single phase of contrast extensive streak artifact from the patient's thoracolumbar hardware. No acute abnormalities present in the visualized portions of the upper abdomen. Musculoskeletal: Cervicothoracic and thoracolumbar fusion hardware is present within the spine there is evidence of adjacent segment disease near the terminus of both fixations. Findings on a background of more diffuse degenerative disease. Mild bilateral gynecomastia is present. No acute or suspicious osseous lesion or soft tissue abnormality is seen. Review of the MIP images confirms the above findings. IMPRESSION: 1. Extensive respiratory motion artifact may limit detection of distal segmental and subsegmental pulmonary emboli. No evidence of large central or segmental pulmonary embolus. 2. Few areas of patchy ground-glass opacity are present in the right  middle lobe, lingula and lower lobes with more consolidative opacity in the right posterior costophrenic sulcus may reflect infectious or inflammatory sequela from known pneumonia and/or features of atelectasis. 3. Cardiomegaly with extensive coronary artery calcification, mitral annular calcification, and calcifications upon the aortic leaflets. 4. Aortic Atherosclerosis (ICD10-I70.0). 5. Indeterminate 2.2 cm hypoattenuating lesion in liver. Incompletely characterized given streak artifact in single phase of imaging. Could be further evaluated with sonography on an outpatient basis. Electronically Signed   By: Kreg ShropshirePrice  DeHay M.D.   On: 06/05/2019 19:14   Dg Chest Portable 1 View  Result Date: 06/05/2019 CLINICAL DATA:  Shortness of breath, hypoxia EXAM: PORTABLE CHEST 1 VIEW COMPARISON:  None. FINDINGS: No consolidation, features of edema, pneumothorax, or effusion. Chronic elevation the right hemidiaphragm pulmonary vascularity is normally distributed. Calcified tortuous aorta the cardiomediastinal contours are otherwise unremarkable. No acute osseous or soft tissue abnormality. Degenerative changes are present in the and imaged spine and shoulders. Partially imaged cervical and thoracolumbar fusion hardware without visible complication. Surgical material projects in the right upper quadrant. IMPRESSION: No acute cardiopulmonary abnormality Electronically Signed   By: Kreg ShropshirePrice  DeHay M.D.   On: 06/05/2019 17:15        Scheduled Meds: . amLODipine  10 mg Oral Daily  .  aspirin EC  81 mg Oral Daily  . carbidopa-levodopa  1 tablet Oral TID  . cholecalciferol  1,000 Units Oral Daily  . clopidogrel  75 mg Oral Daily  . enoxaparin (LOVENOX) injection  40 mg Subcutaneous Q24H  . finasteride  5 mg Oral Daily  . food thickener  1 Container Oral TID  . furosemide  40 mg Intravenous Q12H  . ipratropium  0.5 mg Nebulization Q6H  . loratadine  10 mg Oral Daily  . pantoprazole  40 mg Oral Daily  . traZODone  25  mg Oral QHS   Continuous Infusions: . piperacillin-tazobactam (ZOSYN)  IV 3.375 g (06/06/19 0520)     LOS: 1 day    Time spent: 35min    Zannie CovePreetha Jamarria Real, MD [facility] 06/06/2019, 9:41 AM

## 2019-06-06 NOTE — Plan of Care (Deleted)
Patient will tolerate Dys 3, thin liquid diet with no s/sx of aspiration.

## 2019-06-06 NOTE — Evaluation (Signed)
Clinical/Bedside Swallow Evaluation Patient Details  Name: Frederick AlbinoBill Beigel MRN: [id number] Date of Birth: April 01, 1931  Today's Date: 06/06/2019 Time: SLP Start Time (ACUTE ONLY): [id number] SLP Stop Time (ACUTE ONLY): 0955 SLP Time Calculation (min) (ACUTE ONLY): 32 min  Past Medical History:  Past Medical History:  Diagnosis Date  . CHF (congestive heart failure) (HCC) 2019  . Dementia (HCC) 2019  . Parkinson disease (HCC)   . Stroke [facility](HCC) 09/05/2018   Past Surgical History:  Past Surgical History:  Procedure Laterality Date  . BACK SURGERY    . REPLACEMENT TOTAL KNEE BILATERAL     HPI:  Frederick Decker, 87y/m presented to ED with shortness of breath, dry cough, mild fever and chills. He was treated for possible pneumonia 7/31. PMH significant for dementia, hypertension, stroke with right-sided weakness, dCHF, Parkinson's disease, CKD stage III, GERD, prior CVA. No prior history of swallowing problems.    Assessment / Plan / Recommendation Clinical Impression  Patient was alert and cooperative during evaluation. He presents with oropharyngeal dysphagia. Prolonged mastication with soft solids and poor bolus manipulation. Oral holding with all consistencies, suspect delayed swallow initiation with all consistencies and pharyngeal residue based on delayed throat clearing. RN present and gave whole meds with liquids, pt had an immediate strong coughing response and it took some time for him to recover. Remainder of meds presented in applesauce and were tolerated well with slow oral manipulation and no oral residue. Recommend MBS for further evaluation of swallowing function. RN and pt's daughter present and recommendations were shared.  SLP Visit Diagnosis: Dysphagia, oropharyngeal phase (R13.12)    Aspiration Risk  Moderate aspiration risk    Diet Recommendation NPO   Medication Administration: Whole meds with puree    Other  Recommendations     Follow up Recommendations  MBS         Swallow Study   General Date of Onset: 06/05/19 HPI: Frederick Decker, 87y/m presented to ED with shortness of breath, dry cough, mild fever and chills. He was treated for possible pneumonia 7/31. PMH significant for dementia, hypertension, stroke with right-sided weakness, dCHF, Parkinson's disease, CKD stage III, GERD, prior CVA. No prior history of swallowing problems.  Type of Study: Bedside Swallow Evaluation Previous Swallow Assessment: none Diet Prior to this Study: NPO Temperature Spikes Noted: Yes Respiratory Status: Room air History of Recent Intubation: No Behavior/Cognition: Alert;Cooperative;Pleasant mood Oral Cavity Assessment: Within Functional Limits Oral Care Completed by SLP: Yes Oral Cavity - Dentition: Adequate natural dentition Vision: Functional for self-feeding Self-Feeding Abilities: Able to feed self Patient Positioning: Upright in bed Baseline Vocal Quality: Normal Volitional Cough: Cognitively unable to elicit Volitional Swallow: Unable to elicit    Oral/Motor/Sensory Function Overall Oral Motor/Sensory Function: Within functional limits Facial ROM: Within Functional Limits Facial Symmetry: Within Functional Limits Facial Strength: Within Functional Limits Facial Sensation: Within Functional Limits Lingual ROM: Within Functional Limits Lingual Symmetry: Within Functional Limits Lingual Strength: Within Functional Limits Lingual Sensation: Within Functional Limits   Ice Chips Ice chips: Within functional limits Presentation: Spoon   Thin Liquid Thin Liquid: Within functional limits Presentation: Cup;Spoon;Straw    Nectar Thick Nectar Thick Liquid: Not tested   Honey Thick Honey Thick Liquid: Not tested   Puree Puree: Impaired Presentation: Spoon Pharyngeal Phase Impairments: Throat Clearing - Delayed   Solid     Solid: Impaired Presentation: Self Fed Oral Phase Impairments: Impaired mastication Oral Phase Functional Implications: Prolonged  oral transit;Impaired mastication Pharyngeal Phase Impairments: Suspected delayed Swallow;Decreased hyoid-laryngeal movement;Multiple  swallows;Throat Clearing - Delayed      [place], MA, CCC-SLP 06/06/2019 10:22 AM

## 2019-06-06 NOTE — Progress Notes (Signed)
Modified Barium Swallow Progress Note  Patient Details  Name: Serafin Decatur MRN: [id number] Date of Birth: 1931/01/06  Today's Date: 06/06/2019  Modified Barium Swallow completed.  Full report located under Chart Review in the Imaging Section.  Brief recommendations include the following:  Clinical Impression  Patient has mild oropharyngeal dysphagia. Oral manipulation and mastication are reduced with solids and a delayed swallow to the valleculae sinuses with all consistencies. There was no laryngeal penetration or aspiration across consistencies tested. He requires additional time for each swallow, but is able to protect his airway well. Recommend Dys 3, thin liquids. Medications to be given whole in puree. ST to follow up for diet tolerance and aspiration precaution training.    Swallow Evaluation Recommendations       SLP Diet Recommendations: Dysphagia 3 (Mech soft) solids;Thin liquid   Liquid Administration via: Cup   Medication Administration: Whole meds with puree   Supervision: Staff to assist with self feeding   Compensations: Minimize environmental distractions;Slow rate;Small sips/bites;Follow solids with liquid   Postural Changes: Seated upright at 90 degrees;Remain semi-upright after after feeds/meals (Comment)   Oral Care Recommendations: Oral care BID       [place], MA, CCC-SLP 06/06/2019 1:52 PM

## 2019-06-06 NOTE — Progress Notes (Signed)
  Echocardiogram 2D Echocardiogram has been performed.  Frederick Decker 06/06/2019, 9:23 AM

## 2019-06-07 LAB — BASIC METABOLIC PANEL
Anion gap: 13 (ref 5–15)
BUN: 18 mg/dL (ref 8–23)
CO2: 27 mmol/L (ref 22–32)
Calcium: 9.1 mg/dL (ref 8.9–10.3)
Chloride: 100 mmol/L (ref 98–111)
Creatinine, Ser: 1.52 mg/dL — ABNORMAL HIGH (ref 0.61–1.24)
GFR calc Af Amer: 48 mL/min — ABNORMAL LOW (ref >60)
GFR calc non Af Amer: 41 mL/min — ABNORMAL LOW (ref >60)
Glucose, Bld: 121 mg/dL — ABNORMAL HIGH (ref 70–99)
Potassium: 2.8 mmol/L — ABNORMAL LOW (ref 3.5–5.1)
Sodium: 140 mmol/L (ref 135–145)

## 2019-06-07 LAB — CBC
HCT: 35.1 % — ABNORMAL LOW (ref 39.0–52.0)
Hemoglobin: 12 g/dL — ABNORMAL LOW (ref 13.0–17.0)
MCH: 29.2 pg (ref 26.0–34.0)
MCHC: 34.2 g/dL (ref 30.0–36.0)
MCV: 85.4 fL (ref 80.0–100.0)
Platelets: 215 10*3/uL (ref 150–400)
RBC: 4.11 MIL/uL — ABNORMAL LOW (ref 4.22–5.81)
RDW: 13 % (ref 11.5–15.5)
WBC: 8 10*3/uL (ref 4.0–10.5)
nRBC: 0 % (ref 0.0–0.2)

## 2019-06-07 LAB — CREATININE, SERUM
Creatinine, Ser: 1.52 mg/dL — ABNORMAL HIGH (ref 0.61–1.24)
GFR calc Af Amer: 48 mL/min — ABNORMAL LOW (ref >60)
GFR calc non Af Amer: 41 mL/min — ABNORMAL LOW (ref >60)

## 2019-06-07 MED ORDER — POTASSIUM CHLORIDE CRYS ER 20 MEQ PO TBCR
40.0000 meq | EXTENDED_RELEASE_TABLET | ORAL | Status: AC
  Administered 2019-06-07 (×3): 40 meq via ORAL
  Filled 2019-06-07 (×3): qty 2

## 2019-06-07 NOTE — Evaluation (Signed)
Physical Therapy Evaluation Patient Details Name: Frederick Decker MRN: [id number] DOB: [birth year] Today's Date: 06/07/2019   History of Present Illness  Mr Cirilo Canner, 87y/m presented to ED with shortness of breath, dry cough, mild fever and chills. He was treated for possible pneumonia 7/31. PMH significant for dementia, hypertension, stroke with right-sided weakness, dCHF, Parkinson's disease, CKD stage III, GERD, prior CVA. No prior history of swallowing problems.  Mr Antwain Caliendo, 87y/m presented to ED with shortness of breath, dry cough, mild fever and chills. He was treated for possible pneumonia 7/31. PMH significant for dementia, hypertension, stroke with right-sided weakness, dCHF, Parkinson's disease, CKD stage III, GERD, prior CVA.   Clinical Impression  Pt admitted with/for SOB, aspiration PNA.  Pt is presently fairly total assist for mobility, but has potential to work toward giving more assist to his caregivers..  Pt currently limited functionally due to the problems listed below.  (see problems list.)  Pt will benefit from PT to maximize function and safety to be able to get home safely with available assist.     Follow Up Recommendations Home health PT;Other (comment)(at [facility] that manages high level of care.)    Equipment Recommendations  None recommended by PT    Recommendations for Other Services       Precautions / Restrictions Precautions Precautions: Fall      Mobility  Bed Mobility Overal bed mobility: Needs Assistance Bed Mobility: Supine to Sit     Supine to sit: Max assist     General bed mobility comments: cued for direction and hand over hand assist to help pt use L UE for coming up and scooting to EOB  Transfers Overall transfer level: Needs assistance   Transfers: Squat Pivot Transfers     Squat pivot transfers: Total assist     General transfer comment: pt cued and assisted to pivot.  pt had trouble releasing his L assistance  hand.  Ambulation/Gait                Stairs            Wheelchair Mobility    Modified Rankin (Stroke Patients Only)       Balance Overall balance assessment: Needs assistance Sitting-balance support: Single extremity supported;Bilateral upper extremity supported Sitting balance-Leahy Scale: Poor Sitting balance - Comments: Sat EOB working on balance and challenges, bil LAQ x15 reps.                                     Pertinent Vitals/Pain Pain Assessment: Faces Faces Pain Scale: No hurt    Home Living Family/patient expects to be discharged to:: [facility]isted living                      Prior Function Level of Independence: Needs assistance   Gait / Transfers Assistance Needed: significantly assisted transfers, w/c dependent at this time  ADL's / Homemaking Assistance Needed: staff assists        Hand Dominance        Extremity/Trunk Assessment   Upper Extremity Assessment Upper Extremity Assessment: (R weak and less coordinated, R functional)    Lower Extremity Assessment Lower Extremity Assessment: Generalized weakness;RLE deficits/detail;LLE deficits/detail RLE Deficits / Details: mild knee extension contracture/stiffness, slow and deliberate movement RLE Coordination: decreased fine motor;decreased gross motor LLE Deficits / Details: functional and generally weak,  assists with transfer LLE Coordination: decreased fine  motor    Cervical / Trunk Assessment Cervical / Trunk Assessment: Kyphotic  Communication      Cognition Arousal/Alertness: Awake/alert Behavior During Therapy: WFL for tasks assessed/performed Overall Cognitive Status: History of cognitive impairments - at baseline                                 General Comments: extra time and repetitive cues to follow direction.      General Comments      Exercises     Assessment/Plan    PT Assessment Patient needs continued PT services   PT Problem List Decreased strength;Decreased activity tolerance;Decreased balance;Decreased mobility;Decreased coordination;Decreased cognition       PT Treatment Interventions Functional mobility training;Therapeutic activities;Therapeutic exercise;Balance training;Patient/family education    PT Goals (Current goals can be found in the Care Plan section)  Acute Rehab PT Goals Patient Stated Goal: get some therapy to decrease burden of care PT Goal Formulation: With patient/family Time For Goal Achievement: 06/21/19 Potential to Achieve Goals: Fair    Frequency Min 3X/week   Barriers to discharge        Co-evaluation               AM-PAC PT "6 Clicks" Mobility  Outcome Measure Help needed turning from your back to your side while in a flat bed without using bedrails?: Total Help needed moving from lying on your back to sitting on the side of a flat bed without using bedrails?: Total Help needed moving to and from a bed to a chair (including a wheelchair)?: Total Help needed standing up from a chair using your arms (e.g., wheelchair or bedside chair)?: Total Help needed to walk in hospital room?: Total Help needed climbing 3-5 steps with a railing? : Total 6 Click Score: 6    End of Session   Activity Tolerance: Patient tolerated treatment well;Patient limited by fatigue Patient left: in chair;with call bell/phone within reach;with family/visitor present Nurse Communication: Mobility status PT Visit Diagnosis: Other abnormalities of gait and mobility (R26.89);Muscle weakness (generalized) (M62.81)    Time: [id number] PT Time Calculation (min) (ACUTE ONLY): 47 min   Charges:   PT Evaluation $PT Eval Moderate Complexity: 1 Mod PT Treatments $Therapeutic Activity: 23-37 mins        06/07/2019  Baker BingKen Mackenzee Becvar, PT Acute Rehabilitation Services [phone]  (pager) [phone]  (office)  Eliseo GumKenneth V Athanasius Kesling 06/07/2019, 1:09 PM

## 2019-06-07 NOTE — Progress Notes (Signed)
PROGRESS NOTE    Frederick Decker  KDT:[id number] DOB: [birth year] DOA: 06/05/2019 PCP: System, Pcp Not In  Brief Narrative:HPI: Frederick Decker is a 85 y.o. male with medical history history of dementia, stroke with right-sided weakness, Parkinson's disease, diastolic CHF, stage III chronic kidney disease presented to the emergency room with shortness of breath cough. -Patient at baseline requires assistance in most ADLs, is not able to ambulate without assistance, says only a few words like yes or no. -Family reports that he has gone downhill since his stroke in November. -Now admitted with aspiration pneumonia and acute on chronic diastolic CHF   Assessment & Plan:   Acute respiratory failure with hypoxia ([place]): -Secondary to aspiration pneumonia and acute on chronic diastolic CHF -Continue Zosyn, discontinue doxycycline -Overall improving, continue IV Lasix today -Blood cultures negative thus far -OOB, Pt/OT  Aspiration pneumonia -Suspect he has dysphagia from Parkinson's disease, advanced age, prior CVA -SLP evaluation completed, mild oropharyngeal dysphagia noted, dysphagia 3 diet with thin liquids recommended -Improving, see discussion above  Acute on chronic diastolic CHF (congestive heart failure) ([place]): - 2D echo 09/06/2018 showed EF 60-65%. -Continue IV Lasix 40 mg twice daily -Repeat echo with preserved EF, diastolic dysfunction mild valvular disease -Monitor I/os, daily weights  Elevated troponin: Trop 22.  Likely due to demand ischemia secondary to hypoxia and CHF exacerbation -No further work-up indicated for this, patient denies any chest pain  Chronic kidney disease stage III -Baseline creatinine around 1.5 -Stable  Parkinson disease ([place]): -Continue Sinemet  Stroke [place]): -Continue aspirin, Plavix  Dementia ([place]):  -At baseline can say yes or no and occasionally a sentence, intermittent confusion at baseline -Requires assistance in most ADLs  Essential  hypertension: -IV Hydralazine prn -Continue home medications: Amlodipine  GERD (gastroesophageal reflux disease): -Protonix  Hypokalemia: -Due to diuresis, replete  Liver lesion: CAT showed  Indeterminate 2.2 cm hypoattenuating lesion in liver. Incompletely characterized given streak artifact in single phase of imaging. -need to f/u with PCP  CKD (chronic kidney disease), stage III ([place]): Stable.  Recent baseline creatinine 1.50 on 03/27/2019.  His creatinine is 1.3, BUN 18. Follow-up renal function by BMP   DVT ppx:  SQ Lovenox Code Status: Partial code, family wants CPR but no intubation, given advanced age dementia, Parkinson's disease, stroke and poor functional status at baseline recommended consideration of DNR Family Communication: Discussed with daughter yesterday, will update her today Disposition Plan:   May need SNF Consults called:  none  Consultants:      Procedures:   Antimicrobials:    Subjective: -No events overnight, status post SLP evaluation yesterday, mild intermittent agitation overnight -More stable during the day  Objective: Vitals:   06/06/19 2259 06/07/19 0509 06/07/19 0744 06/07/19 0800  BP: 120/71 123/64  136/66  Pulse: 72 71 82 67  Resp:   18 20  Temp: 98.6 F (37 C)   98 F (36.7 C)  TempSrc: Oral   Oral  SpO2: 93%  95% 92%  Weight:  87.6 kg    Height:        Intake/Output Summary (Last 24 hours) at 06/07/2019 1044 Last data filed at 06/07/2019 0800 Gross per 24 hour  Intake 198 ml  Output 2400 ml  Net -2202 ml   Filed Weights   06/05/19 2000 06/07/19 0509  Weight: 81.6 kg 87.6 kg    Examination:  Gen: Awake alert, mostly nonverbal, mumbles yes HEENT: PERRLA, Neck supple, no JVD Lungs: Bibasilar crackles CVS: RRR,No Gallops,Rubs or new Murmurs Abd:  soft, Non tender, non distended, BS present Extremities: 1+ edema Skin: no new rashes Central nervous system: Right hemiplegia, awake, unable to assess  orientation Psychiatry: Poor insight and judgment    Data Reviewed:   CBC: Recent Labs  Lab 06/05/19 1210 06/07/19 0821  WBC 9.6 8.0  NEUTROABS 7.2  --   HGB 11.9* 12.0*  HCT 36.3* 35.1*  MCV 90.1 85.4  PLT 245 215   Basic Metabolic Panel: Recent Labs  Lab 06/05/19 1210 06/06/19 0212 06/07/19 0424 06/07/19 0821  NA 141  --   --  140  K 3.3*  --   --  2.8*  CL 110  --   --  100  CO2 23  --   --  27  GLUCOSE 131*  --   --  121*  BUN 18  --   --  18  CREATININE 1.30* 1.28* 1.52* 1.52*  CALCIUM 9.0  --   --  9.1  MG  --  1.8  --   --    GFR: Estimated Creatinine Clearance: 36.8 mL/min (A) (by C-G formula based on SCr of 1.52 mg/dL (H)). Liver Function Tests: Recent Labs  Lab 06/05/19 1210  AST 20  ALT 7  ALKPHOS 62  BILITOT 0.6  PROT 6.5  ALBUMIN 3.1*   Recent Labs  Lab 06/05/19 1210  LIPASE 41   Recent Labs  Lab 06/05/19 1240  AMMONIA 20   Coagulation Profile: Recent Labs  Lab 06/05/19 1210  INR 1.1   Cardiac Enzymes: No results for input(s): CKTOTAL, CKMB, CKMBINDEX, TROPONINI in the last 168 hours. BNP (last 3 results) No results for input(s): PROBNP in the last 8760 hours. HbA1C: Recent Labs    06/06/19 0212  HGBA1C 6.1*   CBG: No results for input(s): GLUCAP in the last 168 hours. Lipid Profile: Recent Labs    06/06/19 0212  CHOL 119  HDL 33*  LDLCALC 70  TRIG 78  CHOLHDL 3.6   Thyroid Function Tests: No results for input(s): TSH, T4TOTAL, FREET4, T3FREE, THYROIDAB in the last 72 hours. Anemia Panel: No results for input(s): VITAMINB12, FOLATE, FERRITIN, TIBC, IRON, RETICCTPCT in the last 72 hours. Urine analysis:    Component Value Date/Time   COLORURINE STRAW (A) 06/05/2019 1410   APPEARANCEUR HAZY (A) 06/05/2019 1410   LABSPEC 1.010 06/05/2019 1410   PHURINE 6.0 06/05/2019 1410   GLUCOSEU NEGATIVE 06/05/2019 1410   HGBUR SMALL (A) 06/05/2019 1410   BILIRUBINUR NEGATIVE 06/05/2019 1410   KETONESUR NEGATIVE  06/05/2019 1410   PROTEINUR NEGATIVE 06/05/2019 1410   NITRITE NEGATIVE 06/05/2019 1410   LEUKOCYTESUR NEGATIVE 06/05/2019 1410   Sepsis Labs: @LABRCNTIP (procalcitonin:4,lacticidven:4)  ) Recent Results (from the past 240 hour(s))  Blood culture (routine x 2)     Status: None (Preliminary result)   Collection Time: 06/05/19 12:30 PM   Specimen: BLOOD LEFT HAND  Result Value Ref Range Status   Specimen Description BLOOD LEFT HAND  Final   Special Requests   Final    BOTTLES DRAWN AEROBIC AND ANAEROBIC Blood Culture adequate volume   Culture   Final    NO GROWTH 2 DAYS Performed at [facility], [address], [place], [place] [address]    Report Status PENDING  Incomplete  SARS Coronavirus 2 [facility] order, Performed in [facility] hospital lab) Nasopharyngeal Nasopharyngeal Swab     Status: None   Collection Time: 06/05/19 12:30 PM   Specimen: Nasopharyngeal Swab  Result Value Ref Range Status  SARS Coronavirus 2 NEGATIVE NEGATIVE Final    Comment: (NOTE) If result is NEGATIVE SARS-CoV-2 target nucleic acids are NOT DETECTED. The SARS-CoV-2 RNA is generally detectable in upper and lower  respiratory specimens during the acute phase of infection. The lowest  concentration of SARS-CoV-2 viral copies this assay can detect is 250  copies / mL. A negative result does not preclude SARS-CoV-2 infection  and should not be used as the sole basis for treatment or other  patient management decisions.  A negative result may occur with  improper specimen collection / handling, submission of specimen other  than nasopharyngeal swab, presence of viral mutation(s) within the  areas targeted by this assay, and inadequate number of viral copies  (<250 copies / mL). A negative result must be combined with clinical  observations, patient history, and epidemiological information. If result is POSITIVE SARS-CoV-2 target nucleic acids are DETECTED. The SARS-CoV-2 RNA is generally  detectable in upper and lower  respiratory specimens dur ing the acute phase of infection.  Positive  results are indicative of active infection with SARS-CoV-2.  Clinical  correlation with patient history and other diagnostic information is  necessary to determine patient infection status.  Positive results do  not rule out bacterial infection or co-infection with other viruses. If result is PRESUMPTIVE POSTIVE SARS-CoV-2 nucleic acids MAY BE PRESENT.   A presumptive positive result was obtained on the submitted specimen  and confirmed on repeat testing.  While 2019 novel coronavirus  (SARS-CoV-2) nucleic acids may be present in the submitted sample  additional confirmatory testing may be necessary for epidemiological  and / or clinical management purposes  to differentiate between  SARS-CoV-2 and other Sarbecovirus currently known to infect humans.  If clinically indicated additional testing with an alternate test  methodology [phone] is advised. The SARS-CoV-2 RNA is generally  detectable in upper and lower respiratory sp ecimens during the acute  phase of infection. The expected result is Negative. Fact Sheet for Patients:  BoilerBrush.com.cyhttps://www.fda.gov/media/136312/download Fact Sheet for Healthcare Providers: https://pope.com/https://www.fda.gov/media/136313/download This test is not yet approved or cleared by the [place] FDA and has been authorized for detection and/or diagnosis of SARS-CoV-2 by FDA under an Emergency Use Authorization (EUA).  This EUA will remain in effect (meaning this test can be used) for the duration of the COVID-19 declaration under Section 564(b)(1) of the Act, 21 U.S.C. section 360bbb-3(b)(1), unless the authorization is terminated or revoked sooner. Performed at [facility], [address] [place], [place] [address]   Blood culture (routine x 2)     Status: None (Preliminary result)   Collection Time: 06/05/19 12:40 PM   Specimen: BLOOD  Result Value Ref  Range Status   Specimen Description BLOOD RIGHT ANTECUBITAL  Final   Special Requests   Final    BOTTLES DRAWN AEROBIC AND ANAEROBIC Blood Culture adequate volume   Culture   Final    NO GROWTH 2 DAYS Performed at [facility], [address] [place], [place] [address]    Report Status PENDING  Incomplete  Urine culture     Status: Abnormal   Collection Time: 06/05/19  2:08 PM   Specimen: Urine, Catheterized  Result Value Ref Range Status   Specimen Description URINE, CATHETERIZED  Final   Special Requests   Final    NONE Performed at [facility], [address] [place], [place] [address]    Culture MULTIPLE SPECIES PRESENT, SUGGEST RECOLLECTION (A)  Final   Report  Status 06/06/2019 FINAL  Final  MRSA PCR Screening     Status: None   Collection Time: 06/06/19  6:36 AM   Specimen: Nasopharyngeal  Result Value Ref Range Status   MRSA by PCR NEGATIVE NEGATIVE Final    Comment:        The GeneXpert MRSA Assay (FDA approved for NASAL specimens only), is one component of a comprehensive MRSA colonization surveillance program. It is not intended to diagnose MRSA infection nor to guide or monitor treatment for MRSA infections. Performed at [facility], [address] [place], [place] [address]          Radiology Studies: Ct Head Wo Contrast  Result Date: 06/05/2019 CLINICAL DATA:  Altered mental status. EXAM: CT HEAD WITHOUT CONTRAST TECHNIQUE: Contiguous axial images were obtained from the base of the skull through the vertex without intravenous contrast. COMPARISON:  Head CT and MRI 09/05/2018 FINDINGS: Brain: There is no evidence of acute large territory infarct, intracranial hemorrhage, mass, midline shift, or extra-axial fluid collection. Confluent cerebral white matter hypodensities are similar to the prior CT and nonspecific but compatible with severe chronic small vessel ischemic disease. Moderate ventriculomegaly is unchanged and is again noted to  be out of proportion to the degree of sulcal enlargement with gyral crowding at the vertex. A chronic distal left ACA infarct is noted in the posterior left frontal lobe. There are chronic lacunar infarcts in the thalami. Heterogeneous hypoattenuation is also present in the basal ganglia bilaterally with numerous dilated perivascular spaces shown on MRI. There is a small chronic left cerebellar infarct which is unchanged. Vascular: Calcified atherosclerosis at the skull base. No hyperdense vessel. Skull: No acute fracture or focal osseous lesion. Sinuses/Orbits: No acute findings.  Bilateral cataract extraction. Other: None. IMPRESSION: 1. No evidence of acute intracranial abnormality. 2. Severe chronic small vessel ischemic disease with multiple chronic infarcts as above. 3. Unchanged ventriculomegaly which may reflect central predominant cerebral atrophy or normal pressure hydrocephalus in the appropriate clinical setting. Electronically Signed   By: Sebastian AcheAllen  Grady M.D.   On: 06/05/2019 15:33   Ct Angio Chest Pe W And/or Wo Contrast  Result Date: 06/05/2019 CLINICAL DATA:  PE suspected, high pretest probability, presents with ascites and swelling to legs, new for this patient. Recent pneumonia currently receiving antibiotic therapy. EXAM: CT ANGIOGRAPHY CHEST WITH CONTRAST TECHNIQUE: Multidetector CT imaging of the chest was performed using the standard protocol during bolus administration of intravenous contrast. Multiplanar CT image reconstructions and MIPs were obtained to evaluate the vascular anatomy. CONTRAST:  75mL OMNIPAQUE IOHEXOL 350 MG/ML SOLN COMPARISON:  Chest radiograph same day FINDINGS: Cardiovascular: Satisfactory opacification of the pulmonary arteries to the segmental level. Extensive respiratory motion artifact may limit detection of distal segmental and subsegmental pulmonary emboli. Detection is further complicated by extensive streak artifact from patient's thoracic hardware. No evidence of  large central or segmental pulmonary embolus. The heart is enlarged with extensive coronary artery calcification, mitral annular calcification and calcifications upon the aortic leaflets. No pericardial effusion. Mediastinum/Nodes: No enlarged mediastinal, hilar, or axillary lymph nodes. Thyroid gland, trachea, and esophagus demonstrate no significant findings. Lungs/Pleura: Dependent atelectasis is seen posteriorly. Few areas of patchy ground-glass opacity are present in the right middle lobe, lingula and lung bases which may reflect infectious or inflammatory sequela from known pneumonia. More consolidative opacity is present in the right posterior costophrenic sulcus. There is abundant subpleural fat. No pneumothorax. No visible effusion. Upper Abdomen: 2.2 cm hypoattenuating lesion is seen in segment  4 of the liver, incompletely assessed on this exam due to the single phase of contrast extensive streak artifact from the patient's thoracolumbar hardware. No acute abnormalities present in the visualized portions of the upper abdomen. Musculoskeletal: Cervicothoracic and thoracolumbar fusion hardware is present within the spine there is evidence of adjacent segment disease near the terminus of both fixations. Findings on a background of more diffuse degenerative disease. Mild bilateral gynecomastia is present. No acute or suspicious osseous lesion or soft tissue abnormality is seen. Review of the MIP images confirms the above findings. IMPRESSION: 1. Extensive respiratory motion artifact may limit detection of distal segmental and subsegmental pulmonary emboli. No evidence of large central or segmental pulmonary embolus. 2. Few areas of patchy ground-glass opacity are present in the right middle lobe, lingula and lower lobes with more consolidative opacity in the right posterior costophrenic sulcus may reflect infectious or inflammatory sequela from known pneumonia and/or features of atelectasis. 3. Cardiomegaly  with extensive coronary artery calcification, mitral annular calcification, and calcifications upon the aortic leaflets. 4. Aortic Atherosclerosis (ICD10-I70.0). 5. Indeterminate 2.2 cm hypoattenuating lesion in liver. Incompletely characterized given streak artifact in single phase of imaging. Could be further evaluated with sonography on an outpatient basis. Electronically Signed   By: Kreg ShropshirePrice  DeHay M.D.   On: 06/05/2019 19:14   Dg Chest Portable 1 View  Result Date: 06/05/2019 CLINICAL DATA:  Shortness of breath, hypoxia EXAM: PORTABLE CHEST 1 VIEW COMPARISON:  None. FINDINGS: No consolidation, features of edema, pneumothorax, or effusion. Chronic elevation the right hemidiaphragm pulmonary vascularity is normally distributed. Calcified tortuous aorta the cardiomediastinal contours are otherwise unremarkable. No acute osseous or soft tissue abnormality. Degenerative changes are present in the and imaged spine and shoulders. Partially imaged cervical and thoracolumbar fusion hardware without visible complication. Surgical material projects in the right upper quadrant. IMPRESSION: No acute cardiopulmonary abnormality Electronically Signed   By: Kreg ShropshirePrice  DeHay M.D.   On: 06/05/2019 17:15   Dg Swallowing Func-speech Pathology  Result Date: 06/06/2019 Objective Swallowing Evaluation: Type of Study: MBS-Modified Barium Swallow Study  Patient Details Name: Frederick Decker MRN: [id number] Date of Birth: 05-22-1931 Today's Date: 06/06/2019 Time: SLP Start Time (ACUTE ONLY): 1300 -SLP Stop Time (ACUTE ONLY): 1320 SLP Time Calculation (min) (ACUTE ONLY): 20 min Past Medical History: Past Medical History: Diagnosis Date  CHF (congestive heart failure) (HCC) 2019  Dementia (HCC) 2019  Parkinson disease (HCC)   Stroke (HCC) 09/05/2018 Past Surgical History: Past Surgical History: Procedure Laterality Date  BACK SURGERY    REPLACEMENT TOTAL KNEE BILATERAL   HPI: Mr Frederick AlbinoBill Decker, 87y/m presented to ED with shortness of breath,  dry cough, mild fever and chills. He was treated for possible pneumonia 7/31. PMH significant for dementia, hypertension, stroke with right-sided weakness, dCHF, Parkinson's disease, CKD stage III, GERD, prior CVA. No prior history of swallowing problems.  Subjective: alert and cooperative Assessment / Plan / Recommendation CHL IP CLINICAL IMPRESSIONS 06/06/2019 Clinical Impression Patient has mild oropharyngeal dysphagia. Oral manipulation and mastication are reduced with solids and a delayed swallow to the valleculae sinuses with all consistencies. There was no laryngeal penetration or aspiration across consistencies tested. He requires additional time for each swallow, but is able to protect his airway well. Recommend Dys 3, thin liquids. Medications to be given whole in puree. ST to follow up for diet tolerance and aspiration precaution training.  SLP Visit Diagnosis Dysphagia, oropharyngeal phase (R13.12) Attention and concentration deficit following -- Frontal lobe and executive function deficit following -- Impact  on safety and function Mild aspiration risk   CHL IP TREATMENT RECOMMENDATION 06/06/2019 Treatment Recommendations Therapy as outlined in treatment plan below   Prognosis 06/06/2019 Prognosis for Safe Diet Advancement Good Barriers to Reach Goals Cognitive deficits Barriers/Prognosis Comment -- CHL IP DIET RECOMMENDATION 06/06/2019 SLP Diet Recommendations Dysphagia 3 (Mech soft) solids;Thin liquid Liquid Administration via Cup Medication Administration Whole meds with puree Compensations Minimize environmental distractions;Slow rate;Small sips/bites;Follow solids with liquid Postural Changes Seated upright at 90 degrees;Remain semi-upright after after feeds/meals (Comment)   CHL IP OTHER RECOMMENDATIONS 06/06/2019 Recommended Consults -- Oral Care Recommendations Oral care BID Other Recommendations --   CHL IP FOLLOW UP RECOMMENDATIONS 06/06/2019 Follow up Recommendations Skilled Nursing facility   [facility] IP  FREQUENCY AND DURATION 06/06/2019 Speech Therapy Frequency (ACUTE ONLY) min 1 x/week Treatment Duration 2 weeks      CHL IP ORAL PHASE 06/06/2019 Oral Phase Impaired Oral - Pudding Teaspoon Delayed oral transit Oral - Pudding Cup -- Oral - Honey Teaspoon -- Oral - Honey Cup -- Oral - Nectar Teaspoon -- Oral - Nectar Cup -- Oral - Nectar Straw -- Oral - Thin Teaspoon -- Oral - Thin Cup -- Oral - Thin Straw -- Oral - Puree -- Oral - Mech Soft Delayed oral transit;Lingual pumping Oral - Regular -- Oral - Multi-Consistency -- Oral - Pill -- Oral Phase - Comment --  CHL IP PHARYNGEAL PHASE 06/06/2019 Pharyngeal Phase Impaired Pharyngeal- Pudding Teaspoon -- Pharyngeal -- Pharyngeal- Pudding Cup -- Pharyngeal -- Pharyngeal- Honey Teaspoon -- Pharyngeal -- Pharyngeal- Honey Cup -- Pharyngeal -- Pharyngeal- Nectar Teaspoon -- Pharyngeal -- Pharyngeal- Nectar Cup -- Pharyngeal -- Pharyngeal- Nectar Straw -- Pharyngeal -- Pharyngeal- Thin Teaspoon -- Pharyngeal -- Pharyngeal- Thin Cup Delayed swallow initiation-vallecula Pharyngeal -- Pharyngeal- Thin Straw -- Pharyngeal -- Pharyngeal- Puree -- Pharyngeal -- Pharyngeal- Mechanical Soft -- Pharyngeal -- Pharyngeal- Regular -- Pharyngeal -- Pharyngeal- Multi-consistency -- Pharyngeal -- Pharyngeal- Pill -- Pharyngeal -- Pharyngeal Comment --  CHL IP CERVICAL ESOPHAGEAL PHASE 06/06/2019 Cervical Esophageal Phase WFL Pudding Teaspoon -- Pudding Cup -- Honey Teaspoon -- Honey Cup -- Nectar Teaspoon -- Nectar Cup -- Nectar Straw -- Thin Teaspoon -- Thin Cup -- Thin Straw -- Puree -- Mechanical Soft -- Regular -- Multi-consistency -- Pill -- Cervical Esophageal Comment -- [place] Hose [place], MA, CCC-SLP 06/06/2019 1:50 PM                   Scheduled Meds:  amLODipine  10 mg Oral Daily   aspirin EC  81 mg Oral Daily   carbidopa-levodopa  1 tablet Oral TID   cholecalciferol  1,000 Units Oral Daily   clopidogrel  75 mg Oral Daily   enoxaparin (LOVENOX) injection  40 mg Subcutaneous  Q24H   finasteride  5 mg Oral Daily   food thickener  1 Container Oral TID   furosemide  40 mg Intravenous Q12H   ipratropium  0.5 mg Nebulization BID   loratadine  10 mg Oral Daily   pantoprazole  40 mg Oral Daily   potassium chloride  40 mEq Oral Q2H   traZODone  25 mg Oral QHS   Continuous Infusions:  piperacillin-tazobactam (ZOSYN)  IV 3.375 g (06/07/19 0512)     LOS: 2 days    Time spent:    Zannie Cove, MD [facility] 06/07/2019, 10:44 AM

## 2019-06-08 LAB — BASIC METABOLIC PANEL
Anion gap: 13 (ref 5–15)
BUN: 24 mg/dL — ABNORMAL HIGH (ref 8–23)
CO2: 27 mmol/L (ref 22–32)
Calcium: 9 mg/dL (ref 8.9–10.3)
Chloride: 99 mmol/L (ref 98–111)
Creatinine, Ser: 1.75 mg/dL — ABNORMAL HIGH (ref 0.61–1.24)
GFR calc Af Amer: 40 mL/min — ABNORMAL LOW (ref >60)
GFR calc non Af Amer: 35 mL/min — ABNORMAL LOW (ref >60)
Glucose, Bld: 110 mg/dL — ABNORMAL HIGH (ref 70–99)
Potassium: 2.9 mmol/L — ABNORMAL LOW (ref 3.5–5.1)
Sodium: 139 mmol/L (ref 135–145)

## 2019-06-08 LAB — CBC
HCT: 34.4 % — ABNORMAL LOW (ref 39.0–52.0)
Hemoglobin: 11.7 g/dL — ABNORMAL LOW (ref 13.0–17.0)
MCH: 29.3 pg (ref 26.0–34.0)
MCHC: 34 g/dL (ref 30.0–36.0)
MCV: 86 fL (ref 80.0–100.0)
Platelets: 218 10*3/uL (ref 150–400)
RBC: 4 MIL/uL — ABNORMAL LOW (ref 4.22–5.81)
RDW: 13.1 % (ref 11.5–15.5)
WBC: 8 10*3/uL (ref 4.0–10.5)
nRBC: 0 % (ref 0.0–0.2)

## 2019-06-08 LAB — LEGIONELLA PNEUMOPHILA SEROGP 1 UR AG: L. pneumophila Serogp 1 Ur Ag: NEGATIVE

## 2019-06-08 MED ORDER — IPRATROPIUM BROMIDE 0.02 % IN SOLN: 0.5000 mg | Freq: Four times a day (QID) | RESPIRATORY_TRACT | Status: DC | PRN

## 2019-06-08 MED ORDER — POTASSIUM CHLORIDE CRYS ER 20 MEQ PO TBCR
40.0000 meq | EXTENDED_RELEASE_TABLET | ORAL | Status: AC
  Administered 2019-06-08: 40 meq via ORAL
  Filled 2019-06-08: qty 2

## 2019-06-08 NOTE — Progress Notes (Signed)
  Speech Language Pathology Treatment: Dysphagia  Patient Details Name: Frederick Decker MRN: [id number] DOB: 1930/12/21 Today's Date: 06/08/2019 Time: 8099-8338 SLP Time Calculation (min) (ACUTE ONLY): 12 min  Assessment / Plan / Recommendation Clinical Impression  Patient continues to tolerate current diet of Dys 3, thin liquids. Medications tolerated whole in puree, except potassium that is dissolved first. Educated daughter of aspiration precautions. All swallowing goals have been met. ST to sign off at this time.    HPI HPI: [name], 87y/m presented to ED with shortness of breath, dry cough, mild fever and chills. He was treated for possible pneumonia 7/31. PMH significant for dementia, hypertension, stroke with right-sided weakness, dCHF, Parkinson's disease, CKD stage III, GERD, prior CVA. No prior history of swallowing problems.       SLP Plan  All goals met       Recommendations  Diet recommendations: Dysphagia 3 (mechanical soft);Thin liquid Liquids provided via: Cup;Straw Medication Administration: Whole meds with puree Supervision: Staff to assist with self feeding Compensations: Minimize environmental distractions;Slow rate;Small sips/bites;Follow solids with liquid Postural Changes and/or Swallow Maneuvers: Seated upright 90 degrees                Oral Care Recommendations: Oral care BID Follow up Recommendations: Skilled Nursing facility SLP Visit Diagnosis: Dysphagia, oropharyngeal phase (R13.12) Plan: All goals met       [place], MA, CCC-SLP 06/08/2019 11:25 AM

## 2019-06-08 NOTE — Progress Notes (Signed)
Pharmacy Antibiotic Note  Frederick Decker is a 85 y.o. male admitted on 06/05/2019 with possible aspiration pneumonia. Was recently treated with doxycycline outpt for pneumonia. CXR show potential infiltrates in right midlung field. Pharmacy has been consulted for pip-tazo dosing.  Plan: Zosyn 3.375 gm IV q8h - r hr infusion F/U cxs, clinical improvement, and renal fxn  Height: 5\' 6"  (167.6 cm) Weight: 193 lb 2 oz (87.6 kg) IBW/kg (Calculated) : 63.8  Temp (24hrs), Avg:98.9 F (37.2 C), Min:98.6 F (37 C), Max:99.2 F (37.3 C)  Recent Labs  Lab 06/05/19 1210 06/05/19 1240 06/05/19 1410 06/06/19 0212 06/07/19 0424 06/07/19 0821 06/08/19 0534  WBC 9.6  --   --   --   --  8.0 8.0  CREATININE 1.30*  --   --  1.28* 1.52* 1.52* 1.75*  LATICACIDVEN  --  1.5 0.9  --   --   --   --     Estimated Creatinine Clearance: 32 mL/min (A) (by C-G formula based on SCr of 1.75 mg/dL (H)).    No Known Allergies  Antimicrobials this admission: 8/6 Zosyn >> Doxy po 8/6 pm x 1 dose > d/c  Microbiology results: 8/6 BCx: ngtd 8/6 UCx: mult sp-final 8/7 Strep pneumo: neg 8/7 MRSA pcr: negative 8/6 COVID: negative  Thank you for allowing pharmacy to be a part of this patient's care.  Minda Ditto PharmD 06/08/2019 11:34 AM

## 2019-06-08 NOTE — Evaluation (Signed)
Occupational Therapy Evaluation Patient Details Name: Frederick Decker MRN: [id number] DOB: August 22, 1931 Today's Date: 06/08/2019    History of Present Illness [name], 87y/m presented to ED with shortness of breath, dry cough, mild fever and chills. Dx:  acute respiratory failure with hypoxia, aspiration PNA, acute on chronic diastolic CHF. PMH significant for dementia, hypertension, stroke with right-sided weakness, dCHF, Parkinson's disease, CKD stage III, GERD, prior CVA. No prior history of swallowing problems.     Clinical Impression   Pt admitted with above. He demonstrates the below listed deficits and will benefit from continued OT to maximize safety and independence with BADLs.  Pt presents to OT with decreased activity tolerance, impaired balance, generalized weakness, Rt hemiparesis, as well as impaired cognition.  He was able to sit EOB x 15 mins with min guard to max A, and followed simple motor commands inconsistently.   He currently requires total A for all aspects of ADLs.  Per chart, pt was admitted from either SNF or ALF, and required assist for most ADLs, and was assisted to w/c by staff (family not present to confirm).  IF pt is from ALF, and they are able to accept him back, recommend HHOT.  If was from SNF, recommend trial of OT upon return      Follow Up Recommendations  SNF;Home health OT (if from ALF)   Equipment Recommendations  None recommended by OT    Recommendations for Other Services       Precautions / Restrictions Precautions Precautions: Fall      Mobility Bed Mobility Overal bed mobility: Needs Assistance Bed Mobility: Supine to Sit;Sit to Supine     Supine to sit: Total assist Sit to supine: Total assist   General bed mobility comments: Pt will assist with attempting to lift trunk   Transfers                 General transfer comment: did not attempt     Balance Overall balance assessment: Needs assistance Sitting-balance support:  No upper extremity supported;Single extremity supported;Feet supported Sitting balance-Leahy Scale: Poor Sitting balance - Comments: Pt sat EOB x ~15 mins initially with heavy posterior lean.  he will initiate pulling forward and able to facilitate anterior translation of trunk during reach activities.  He was able to sit with min guard assist x ~ 3 mins, otherwise fluctuated between min - max A    Postural control: Posterior lean                                 ADL either performed or assessed with clinical judgement   ADL                                         General ADL Comments: Pt requires total A for all aspects     Vision   Additional Comments: Pt will track therapist in the room      Perception     Praxis      Pertinent Vitals/Pain Pain Assessment: Faces Faces Pain Scale: No hurt     Hand Dominance (unsure )   Extremity/Trunk Assessment Upper Extremity Assessment Upper Extremity Assessment: RUE deficits/detail;LUE deficits/detail RUE Deficits / Details: Pt moves Rt UE less so than Lt, but will hold items in Rt hand, and did initiate reach x 1  RUE Coordination: decreased fine motor;decreased gross motor LUE Deficits / Details: pt with limited active spontaneous movement, but will attempt to reach with Lt UE  LUE Coordination: decreased fine motor;decreased gross motor   Lower Extremity Assessment Lower Extremity Assessment: Defer to PT evaluation   Cervical / Trunk Assessment Cervical / Trunk Assessment: Kyphotic;Other exceptions Cervical / Trunk Exceptions: flexed trunk, with head/neck in capital extension and rotated to the Rt    Communication Communication Communication: Expressive difficulties;Receptive difficulties(pt not verbal, follows commands intermittently )   Cognition Arousal/Alertness: Awake/alert Behavior During Therapy: Flat affect;WFL for tasks assessed/performed Overall Cognitive Status: No family/caregiver  present to determine baseline cognitive functioning                                 General Comments: Pt smiled to therapist, but other times flat affect.  He waved appropriately to say "good bye".  He did not attempt to speak, but did initiate reaching for object x 2 when prompted to do so - did not attempt to follow any other commands    General Comments       Exercises     Shoulder Instructions      Home Living Family/patient expects to be discharged to:: Assisted living                                 Additional Comments: Per chart, pt was residing in ALF.  no family present       Prior Functioning/Environment Level of Independence: Needs assistance  Gait / Transfers Assistance Needed: Per chart review, pt was non ambulatory, but was assisted to w/c  ADL's / Homemaking Assistance Needed: per chart, he was requiring assist with most ADLs    Comments: no family present         OT Problem List: Decreased strength;Decreased range of motion;Decreased activity tolerance;Impaired balance (sitting and/or standing);Decreased coordination;Decreased cognition;Decreased safety awareness;Decreased knowledge of use of DME or AE;Impaired UE functional use      OT Treatment/Interventions: Self-care/ADL training;Neuromuscular education;DME and/or AE instruction;Therapeutic activities;Cognitive remediation/compensation;Patient/family education;Balance training    OT Goals(Current goals can be found in the care plan section) Acute Rehab OT Goals OT Goal Formulation: Patient unable to participate in goal setting Time For Goal Achievement: 06/22/19 Potential to Achieve Goals: Good ADL Goals Pt Will Perform Grooming: with mod assist;sitting Pt Will Transfer to Toilet: with mod assist;bedside commode  OT Frequency: Min 2X/week   Barriers to D/C: Decreased caregiver support          Co-evaluation              AM-PAC OT "6 Clicks" Daily Activity      Outcome Measure Help from another person eating meals?: Total Help from another person taking care of personal grooming?: Total Help from another person toileting, which includes using toliet, bedpan, or urinal?: Total Help from another person bathing (including washing, rinsing, drying)?: Total Help from another person to put on and taking off regular upper body clothing?: Total Help from another person to put on and taking off regular lower body clothing?: Total 6 Click Score: 6   End of Session Nurse Communication: Mobility status  Activity Tolerance: Patient limited by fatigue Patient left: in bed;with bed alarm set;with call bell/phone within reach  OT Visit Diagnosis: Hemiplegia and hemiparesis;Muscle weakness (generalized) (M62.81) Hemiplegia - Right/Left: Right Hemiplegia - dominant/non-dominant:  Dominant Hemiplegia - caused by: Cerebral infarction                Time: 2536-6440 OT Time Calculation (min): 25 min Charges:  OT General Charges $OT Visit: 1 Visit OT Evaluation $OT Eval Moderate Complexity: 1 Mod OT Treatments $Neuromuscular Re-education: 8-22 mins  Lucille Passy, OTR/L Acute Rehabilitation Services Pager [phone] Office [phone]   Lucille Passy M 06/08/2019, 4:40 PM

## 2019-06-08 NOTE — Progress Notes (Addendum)
PROGRESS NOTE    Frederick Decker  ZOX:[id number] DOB: 06-09-1931 DOA: 06/05/2019 PCP: System, Pcp Not In  Brief Narrative:HPI: Frederick Decker is a 85 y.o. male with medical history history of dementia, stroke with right-sided weakness, Parkinson's disease, diastolic CHF, stage III chronic kidney disease presented to the emergency room with shortness of breath cough. -Patient at baseline requires assistance in most ADLs, is not able to ambulate without assistance, says only a few words like yes or no. -Family reports that he has gone downhill since his stroke in November. -Now admitted with aspiration pneumonia and acute on chronic diastolic CHF   Assessment & Plan:   Acute respiratory failure with hypoxia (HCC): -Secondary to aspiration pneumonia and acute on chronic diastolic CHF -Clinically Improving on IV Zosyn, changed to oral Augmentin tomorrow -Volume status much improved as well -Blood cultures negative thus far -OOB, Pt/OT -Discharge planning -More sleepy this morning I suspect this is from not having had enough sleep last night, will monitor  Aspiration pneumonia -Suspect he has dysphagia from Parkinson's disease, advanced age, prior CVA -SLP evaluation completed, mild oropharyngeal dysphagia noted, dysphagia 3 diet with thin liquids recommended -Improving, see discussion above, changed to oral antibiotics tomorrow  Acute on chronic diastolic CHF (congestive heart failure) (HCC): - 2D echo 09/06/2018 showed EF 60-65%. -Diuresed with IV Lasix with good effect, repeat echo noted preserved EF, diastolic dysfunction -Given mild worsening creatinine will hold Lasix today -Volume status much improved  Elevated troponin: Trop 22.  Likely due to demand ischemia secondary to hypoxia and CHF exacerbation -No further work-up indicated for this, patient denies any chest pain  Chronic kidney disease stage III -Baseline creatinine around 1.5 -Stable  Hypokalemia -From diuretics, replace   Parkinson disease (HCC): -Continue Sinemet  Stroke [facility](HCC): -Continue aspirin, Plavix  Dementia (HCC):  -At baseline can say yes or no and occasionally a sentence, intermittent confusion at baseline -Requires assistance in most ADLs  Essential hypertension: -IV Hydralazine prn -Continue home medications: Amlodipine  GERD (gastroesophageal reflux disease): -Protonix  Hypokalemia: -Due to diuresis, replete  Liver lesion: CAT showed  Indeterminate 2.2 cm hypoattenuating lesion in liver. Incompletely characterized given streak artifact in single phase of imaging. -need to f/u with PCP  CKD (chronic kidney disease), stage III (HCC): Stable.  Recent baseline creatinine 1.50 on 03/27/2019.  His creatinine is 1.3, BUN 18. Follow-up renal function by BMP  DVT ppx:  SQ Lovenox Code Status: DNR, discussed with daughter Family Communication: Discussed with daughter at bedside Disposition Plan:   Back to assisted living with home health services Consults called:  none  Consultants:      Procedures:   Antimicrobials:    Subjective: -No events overnight, status post SLP evaluation yesterday, mild intermittent agitation overnight -More stable during the day  Objective: Vitals:   06/07/19 2111 06/08/19 0502 06/08/19 0721 06/08/19 0825  BP: 124/77 118/75  133/78  Pulse: 77   67  Resp: 16   17  Temp: 99.2 F (37.3 C)   98.6 F (37 C)  TempSrc: Oral   Oral  SpO2: 92%  95% 95%  Weight:      Height:        Intake/Output Summary (Last 24 hours) at 06/08/2019 1215 Last data filed at 06/08/2019 0416 Gross per 24 hour  Intake 200 ml  Output 1650 ml  Net -1450 ml   Filed Weights   06/05/19 2000 06/07/19 0509  Weight: 81.6 kg 87.6 kg    Examination:  Gen: More somnolent  today but easily arousable, unable to assess orientation HEENT: PERRLA, Neck supple, no JVD Lungs: Decreased breath sounds at both bases CVS: RRR,No Gallops,Rubs or new Murmurs Abd: soft, Non  tender, non distended, BS present Extremities: No edema Skin: no new rashes Central nervous system: Right hemiplegia, somnolent today, unable to assess orientation Psychiatry: Poor insight and judgment    Data Reviewed:   CBC: Recent Labs  Lab 06/05/19 1210 06/07/19 0821 06/08/19 0534  WBC 9.6 8.0 8.0  NEUTROABS 7.2  --   --   HGB 11.9* 12.0* 11.7*  HCT 36.3* 35.1* 34.4*  MCV 90.1 85.4 86.0  PLT 245 215 218   Basic Metabolic Panel: Recent Labs  Lab 06/05/19 1210 06/06/19 0212 06/07/19 0424 06/07/19 0821 06/08/19 0534  NA 141  --   --  140 139  K 3.3*  --   --  2.8* 2.9*  CL 110  --   --  100 99  CO2 23  --   --  27 27  GLUCOSE 131*  --   --  121* 110*  BUN 18  --   --  18 24*  CREATININE 1.30* 1.28* 1.52* 1.52* 1.75*  CALCIUM 9.0  --   --  9.1 9.0  MG  --  1.8  --   --   --    GFR: Estimated Creatinine Clearance: 32 mL/min (A) (by C-G formula based on SCr of 1.75 mg/dL (H)). Liver Function Tests: Recent Labs  Lab 06/05/19 1210  AST 20  ALT 7  ALKPHOS 62  BILITOT 0.6  PROT 6.5  ALBUMIN 3.1*   Recent Labs  Lab 06/05/19 1210  LIPASE 41   Recent Labs  Lab 06/05/19 1240  AMMONIA 20   Coagulation Profile: Recent Labs  Lab 06/05/19 1210  INR 1.1   Cardiac Enzymes: No results for input(s): CKTOTAL, CKMB, CKMBINDEX, TROPONINI in the last 168 hours. BNP (last 3 results) No results for input(s): PROBNP in the last 8760 hours. HbA1C: Recent Labs    06/06/19 0212  HGBA1C 6.1*   CBG: No results for input(s): GLUCAP in the last 168 hours. Lipid Profile: Recent Labs    06/06/19 0212  CHOL 119  HDL 33*  LDLCALC 70  TRIG 78  CHOLHDL 3.6   Thyroid Function Tests: No results for input(s): TSH, T4TOTAL, FREET4, T3FREE, THYROIDAB in the last 72 hours. Anemia Panel: No results for input(s): VITAMINB12, FOLATE, FERRITIN, TIBC, IRON, RETICCTPCT in the last 72 hours. Urine analysis:    Component Value Date/Time   COLORURINE STRAW (A) 06/05/2019  1410   APPEARANCEUR HAZY (A) 06/05/2019 1410   LABSPEC 1.010 06/05/2019 1410   PHURINE 6.0 06/05/2019 1410   GLUCOSEU NEGATIVE 06/05/2019 1410   HGBUR SMALL (A) 06/05/2019 1410   BILIRUBINUR NEGATIVE 06/05/2019 1410   KETONESUR NEGATIVE 06/05/2019 1410   PROTEINUR NEGATIVE 06/05/2019 1410   NITRITE NEGATIVE 06/05/2019 1410   LEUKOCYTESUR NEGATIVE 06/05/2019 1410   Sepsis Labs: @LABRCNTIP (procalcitonin:4,lacticidven:4)  ) Recent Results (from the past 240 hour(s))  Blood culture (routine x 2)     Status: None (Preliminary result)   Collection Time: 06/05/19 12:30 PM   Specimen: BLOOD LEFT HAND  Result Value Ref Range Status   Specimen Description BLOOD LEFT HAND  Final   Special Requests   Final    BOTTLES DRAWN AEROBIC AND ANAEROBIC Blood Culture adequate volume   Culture   Final    NO GROWTH 3 DAYS Performed at [facility], [address], [place], [place] [address]    Report Status PENDING  Incomplete  SARS Coronavirus 2 [facility] order, Performed in [facility] hospital lab) Nasopharyngeal Nasopharyngeal Swab     Status: None   Collection Time: 06/05/19 12:30 PM   Specimen: Nasopharyngeal Swab  Result Value Ref Range Status   SARS Coronavirus 2 NEGATIVE NEGATIVE Final    Comment: (NOTE) If result is NEGATIVE SARS-CoV-2 target nucleic acids are NOT DETECTED. The SARS-CoV-2 RNA is generally detectable in upper and lower  respiratory specimens during the acute phase of infection. The lowest  concentration of SARS-CoV-2 viral copies this assay can detect is 250  copies / mL. A negative result does not preclude SARS-CoV-2 infection  and should not be used as the sole basis for treatment or other  patient management decisions.  A negative result may occur with  improper specimen collection / handling, submission of specimen other  than nasopharyngeal swab, presence of viral mutation(s) within the  areas targeted by this assay, and inadequate number of viral copies   (<250 copies / mL). A negative result must be combined with clinical  observations, patient history, and epidemiological information. If result is POSITIVE SARS-CoV-2 target nucleic acids are DETECTED. The SARS-CoV-2 RNA is generally detectable in upper and lower  respiratory specimens dur ing the acute phase of infection.  Positive  results are indicative of active infection with SARS-CoV-2.  Clinical  correlation with patient history and other diagnostic information is  necessary to determine patient infection status.  Positive results do  not rule out bacterial infection or co-infection with other viruses. If result is PRESUMPTIVE POSTIVE SARS-CoV-2 nucleic acids MAY BE PRESENT.   A presumptive positive result was obtained on the submitted specimen  and confirmed on repeat testing.  While 2019 novel coronavirus  (SARS-CoV-2) nucleic acids may be present in the submitted sample  additional confirmatory testing may be necessary for epidemiological  and / or clinical management purposes  to differentiate between  SARS-CoV-2 and other Sarbecovirus currently known to infect humans.  If clinically indicated additional testing with an alternate test  methodology [phone]) is advised. The SARS-CoV-2 RNA is generally  detectable in upper and lower respiratory sp ecimens during the acute  phase of infection. The expected result is Negative. Fact Sheet for Patients:  StrictlyIdeas.no Fact Sheet for Healthcare Providers: BankingDealers.co.za This test is not yet approved or cleared by the [place] FDA and has been authorized for detection and/or diagnosis of SARS-CoV-2 by FDA under an Emergency Use Authorization (EUA).  This EUA will remain in effect (meaning this test can be used) for the duration of the COVID-19 declaration under Section 564(b)(1) of the Act, 21 U.S.C. section 360bbb-3(b)(1), unless the authorization is terminated or  revoked sooner. Performed at [facility], [place] [address] [place], [place] [address]   Blood culture (routine x 2)     Status: None (Preliminary result)   Collection Time: 06/05/19 12:40 PM   Specimen: BLOOD  Result Value Ref Range Status   Specimen Description BLOOD RIGHT ANTECUBITAL  Final   Special Requests   Final    BOTTLES DRAWN AEROBIC AND ANAEROBIC Blood Culture adequate volume   Culture   Final    NO GROWTH 3 DAYS Performed at [facility], [place] [address], [place], [place] [address]    Report Status PENDING  Incomplete  Urine culture     Status: Abnormal   Collection Time: 06/05/19  2:08 PM   Specimen: Urine, Catheterized  Result Value Ref Range Status   Specimen Description URINE, CATHETERIZED  Final   Special Requests   Final    NONE Performed at [facility], [address] [place], [place] [address]    Culture MULTIPLE SPECIES PRESENT, SUGGEST RECOLLECTION (A)  Final   Report Status 06/06/2019 FINAL  Final  MRSA PCR Screening     Status: None   Collection Time: 06/06/19  6:36 AM   Specimen: Nasopharyngeal  Result Value Ref Range Status   MRSA by PCR NEGATIVE NEGATIVE Final    Comment:        The GeneXpert MRSA Assay (FDA approved for NASAL specimens only), is one component of a comprehensive MRSA colonization surveillance program. It is not intended to diagnose MRSA infection nor to guide or monitor treatment for MRSA infections. Performed at [facility], [address] [place], [place] [address]          Radiology Studies: Dg Swallowing Func-speech Pathology  Result Date: 06/06/2019 Objective Swallowing Evaluation: Type of Study: MBS-Modified Barium Swallow Study  Patient Details Name: Frederick Decker MRN: [id number] Date of Birth: 25-Jun-1931 Today's Date: 06/06/2019 Time: SLP Start Time (ACUTE ONLY): 1300 -SLP Stop Time (ACUTE ONLY): 1320 SLP Time Calculation (min) (ACUTE ONLY): 20 min Past Medical History: Past Medical History:  Diagnosis Date . CHF (congestive heart failure) (HCC) 2019 . Dementia (HCC) 2019 . Parkinson disease (HCC)  . Stroke [facility](HCC) 09/05/2018 Past Surgical History: Past Surgical History: Procedure Laterality Date . BACK SURGERY   . REPLACEMENT TOTAL KNEE BILATERAL   HPI: Frederick Decker, 87y/m presented to ED with shortness of breath, dry cough, mild fever and chills. He was treated for possible pneumonia 7/31. PMH significant for dementia, hypertension, stroke with right-sided weakness, dCHF, Parkinson's disease, CKD stage III, GERD, prior CVA. No prior history of swallowing problems.  Subjective: alert and cooperative Assessment / Plan / Recommendation CHL IP CLINICAL IMPRESSIONS 06/06/2019 Clinical Impression Patient has mild oropharyngeal dysphagia. Oral manipulation and mastication are reduced with solids and a delayed swallow to the valleculae sinuses with all consistencies. There was no laryngeal penetration or aspiration across consistencies tested. He requires additional time for each swallow, but is able to protect his airway well. Recommend Dys 3, thin liquids. Medications to be given whole in puree. ST to follow up for diet tolerance and aspiration precaution training.  SLP Visit Diagnosis Dysphagia, oropharyngeal phase (R13.12) Attention and concentration deficit following -- Frontal lobe and executive function deficit following -- Impact on safety and function Mild aspiration risk   CHL IP TREATMENT RECOMMENDATION 06/06/2019 Treatment Recommendations Therapy as outlined in treatment plan below   Prognosis 06/06/2019 Prognosis for Safe Diet Advancement Good Barriers to Reach Goals Cognitive deficits Barriers/Prognosis Comment -- CHL IP DIET RECOMMENDATION 06/06/2019 SLP Diet Recommendations Dysphagia 3 (Mech soft) solids;Thin liquid Liquid Administration via Cup Medication Administration Whole meds with puree Compensations Minimize environmental distractions;Slow rate;Small sips/bites;Follow solids with liquid Postural  Changes Seated upright at 90 degrees;Remain semi-upright after after feeds/meals (Comment)   CHL IP OTHER RECOMMENDATIONS 06/06/2019 Recommended Consults -- Oral Care Recommendations Oral care BID Other Recommendations --   CHL IP FOLLOW UP RECOMMENDATIONS 06/06/2019 Follow up Recommendations [facility]QU[facility] D[facility] 06/06/2019 Speech Therapy Frequency (ACUTE ONLY) min 1 x/week Treatment Duration 2 weeks      CHL IP ORAL PHASE 06/06/2019 Oral Phase Impaired Oral - Pudding Teaspoon Delayed oral transit Oral - Pudding Cup -- Oral - Honey Teaspoon --  Oral - Honey Cup -- Oral - Nectar Teaspoon -- Oral - Nectar Cup -- Oral - Nectar Straw -- Oral - Thin Teaspoon -- Oral - Thin Cup -- Oral - Thin Straw -- Oral - Puree -- Oral - Mech Soft Delayed oral transit;Lingual pumping Oral - Regular -- Oral - Multi-Consistency -- Oral - Pill -- Oral Phase - Comment --  CHL IP PHARYNGEAL PHASE 06/06/2019 Pharyngeal Phase Impaired Pharyngeal- Pudding Teaspoon -- Pharyngeal -- Pharyngeal- Pudding Cup -- Pharyngeal -- Pharyngeal- Honey Teaspoon -- Pharyngeal -- Pharyngeal- Honey Cup -- Pharyngeal -- Pharyngeal- Nectar Teaspoon -- Pharyngeal -- Pharyngeal- Nectar Cup -- Pharyngeal -- Pharyngeal- Nectar Straw -- Pharyngeal -- Pharyngeal- Thin Teaspoon -- Pharyngeal -- Pharyngeal- Thin Cup Delayed swallow initiation-vallecula Pharyngeal -- Pharyngeal- Thin Straw -- Pharyngeal -- Pharyngeal- Puree -- Pharyngeal -- Pharyngeal- Mechanical Soft -- Pharyngeal -- Pharyngeal- Regular -- Pharyngeal -- Pharyngeal- Multi-consistency -- Pharyngeal -- Pharyngeal- Pill -- Pharyngeal -- Pharyngeal Comment --  CHL IP CERVICAL ESOPHAGEAL PHASE 06/06/2019 Cervical Esophageal Phase WFL Pudding Teaspoon -- Pudding Cup -- Honey Teaspoon -- Honey Cup -- Nectar Teaspoon -- Nectar Cup -- Nectar Straw -- Thin Teaspoon -- Thin Cup -- Thin Straw -- Puree -- Mechanical Soft -- Regular -- Multi-consistency -- Pill -- Cervical Esophageal Comment --  Lindalou HoseSarah J. Ward, MA, CCC-SLP 06/06/2019 1:50 PM                   Scheduled Meds: . amLODipine  10 mg Oral Daily  . aspirin EC  81 mg Oral Daily  . carbidopa-levodopa  1 tablet Oral TID  . cholecalciferol  1,000 Units Oral Daily  . clopidogrel  75 mg Oral Daily  . enoxaparin (LOVENOX) injection  40 mg Subcutaneous Q24H  . finasteride  5 mg Oral Daily  . food thickener  1 Container Oral TID  . ipratropium  0.5 mg Nebulization BID  . loratadine  10 mg Oral Daily  . pantoprazole  40 mg Oral Daily  . potassium chloride  40 mEq Oral Q2H  . traZODone  25 mg Oral QHS   Continuous Infusions: . piperacillin-tazobactam (ZOSYN)  IV 3.375 g (06/08/19 0501)     LOS: 3 days    Time spent: 25min    Zannie CovePreetha Koleman Marling, MD [facility] 06/08/2019, 12:15 PM

## 2019-06-09 LAB — CBC
HCT: 33.8 % — ABNORMAL LOW (ref 39.0–52.0)
Hemoglobin: 11.5 g/dL — ABNORMAL LOW (ref 13.0–17.0)
MCH: 29.4 pg (ref 26.0–34.0)
MCHC: 34 g/dL (ref 30.0–36.0)
MCV: 86.4 fL (ref 80.0–100.0)
Platelets: 217 10*3/uL (ref 150–400)
RBC: 3.91 MIL/uL — ABNORMAL LOW (ref 4.22–5.81)
RDW: 13.2 % (ref 11.5–15.5)
WBC: 7.9 10*3/uL (ref 4.0–10.5)
nRBC: 0 % (ref 0.0–0.2)

## 2019-06-09 LAB — BASIC METABOLIC PANEL
Anion gap: 10 (ref 5–15)
BUN: 22 mg/dL (ref 8–23)
CO2: 28 mmol/L (ref 22–32)
Calcium: 8.9 mg/dL (ref 8.9–10.3)
Chloride: 101 mmol/L (ref 98–111)
Creatinine, Ser: 1.66 mg/dL — ABNORMAL HIGH (ref 0.61–1.24)
GFR calc Af Amer: 43 mL/min — ABNORMAL LOW (ref >60)
GFR calc non Af Amer: 37 mL/min — ABNORMAL LOW (ref >60)
Glucose, Bld: 108 mg/dL — ABNORMAL HIGH (ref 70–99)
Potassium: 3.1 mmol/L — ABNORMAL LOW (ref 3.5–5.1)
Sodium: 139 mmol/L (ref 135–145)

## 2019-06-09 MED ORDER — POTASSIUM CHLORIDE CRYS ER 20 MEQ PO TBCR: 40.0000 meq | EXTENDED_RELEASE_TABLET | Freq: Two times a day (BID) | ORAL | Status: DC

## 2019-06-09 MED ORDER — POTASSIUM CHLORIDE CRYS ER 20 MEQ PO TBCR
40.0000 meq | EXTENDED_RELEASE_TABLET | Freq: Three times a day (TID) | ORAL | Status: AC
  Administered 2019-06-09 (×3): 40 meq via ORAL
  Filled 2019-06-09 (×2): qty 2

## 2019-06-09 MED ORDER — AMOXICILLIN-POT CLAVULANATE 875-125 MG PO TABS
1.0000 | ORAL_TABLET | Freq: Two times a day (BID) | ORAL | Status: DC
  Administered 2019-06-09 – 2019-06-10 (×3): 1 via ORAL
  Filled 2019-06-09 (×3): qty 1

## 2019-06-09 MED ORDER — FUROSEMIDE 40 MG PO TABS
40.0000 mg | ORAL_TABLET | Freq: Every day | ORAL | Status: DC
  Administered 2019-06-09 – 2019-06-12 (×4): 40 mg via ORAL
  Filled 2019-06-09 (×4): qty 1

## 2019-06-09 MED ORDER — POTASSIUM CHLORIDE CRYS ER 20 MEQ PO TBCR
40.0000 meq | EXTENDED_RELEASE_TABLET | Freq: Three times a day (TID) | ORAL | Status: DC
  Filled 2019-06-09: qty 2

## 2019-06-09 NOTE — Care Management Important Message (Signed)
Important Message  Patient Details  Name: Frederick Decker MRN: [id number] Date of Birth: 1931/09/06   Medicare Important Message Given:  Yes     Janssen Zee [address]ine [address] 06/09/2019, 3:27 PM

## 2019-06-09 NOTE — Progress Notes (Signed)
PROGRESS NOTE    Frederick Decker  SAY:[id number] DOB: 02-Apr-1931 DOA: 06/05/2019 PCP: System, Pcp Not In  Brief Narrative:HPI: Frederick Decker is a 85 y.o. male with medical history history of dementia, stroke with right-sided weakness, Parkinson's disease, chronic diastolic CHF, stage III chronic kidney disease presented to the emergency room with shortness of breath cough. -Patient at baseline requires assistance in most ADLs, is not able to ambulate without assistance, says only a few words like yes or no. -Family reports that he has gone downhill since his stroke in November. -Now admitted with aspiration pneumonia and acute on chronic diastolic CHF   Assessment & Plan:   Acute respiratory failure with hypoxia ([place]): -Secondary to aspiration pneumonia and acute on chronic diastolic CHF -Clinically improving on IV Zosyn, changed to Augmentin today to complete 7-day course  -Volume status much improved as well -Blood cultures negative thus far -OOB, Pt/OT -Discharge planning, back to ALF with home health services tomorrow if stable  Aspiration pneumonia -Suspect he has dysphagia from Parkinson's disease, advanced age, prior CVA -SLP evaluation completed, mild oropharyngeal dysphagia noted, dysphagia 3 diet with thin liquids recommended -Improving, see discussion above, changed to oral antibiotics today  Acute on chronic diastolic CHF (congestive heart failure) ([place]): - 2D echo 09/06/2018 showed EF 60-65%. -Diuresed with IV Lasix with good effect, repeat echo noted preserved EF, diastolic dysfunction -Volume status much improved -Change to oral Lasix today  Elevated troponin: Trop 22.  Likely due to demand ischemia secondary to hypoxia and CHF exacerbation -No further work-up indicated for this, patient denies any chest pain  Chronic kidney disease stage III -Baseline creatinine around 1.5 -Stable  Hypokalemia -From diuretics, replace  Parkinson disease ([place]): -Continue Sinemet   Stroke [facility]): -Continue aspirin, Plavix  Dementia ([place]):  -At baseline can say yes or no and occasionally a sentence, intermittent confusion at baseline -Requires assistance in most ADLs  Essential hypertension: -IV Hydralazine prn -Continue home medications: Amlodipine  GERD (gastroesophageal reflux disease): -Protonix  Hypokalemia: -Due to diuresis, replete  Liver lesion: CAT showed  Indeterminate 2.2 cm hypoattenuating lesion in liver. Incompletely characterized given streak artifact in single phase of imaging. -need to f/u with PCP  CKD (chronic kidney disease), stage III ([place]): Stable.  Recent baseline creatinine 1.50 on 03/27/2019.  His creatinine is 1.3, BUN 18. Follow-up renal function by BMP  DVT ppx:  SQ Lovenox Code Status: DNR, discussed with daughter Family Communication: Discussed with daughter at bedside Disposition Plan:   Back to assisted living with home health services Consults called:  none  Consultants:      Procedures:   Antimicrobials:    Subjective: -Awake this morning, more interactive, said a few words to me  Objective: Vitals:   06/08/19 0825 06/08/19 1923 06/08/19 2351 06/09/19 0710  BP: 133/78  123/69 137/74  Pulse: 67  70 65  Resp: 17   18  Temp: 98.6 F (37 C)  97.9 F (36.6 C) 98.6 F (37 C)  TempSrc: Oral  Oral Oral  SpO2: 95% 93% 92% 92%  Weight:      Height:        Intake/Output Summary (Last 24 hours) at 06/09/2019 1305 Last data filed at 06/09/2019 1216 Gross per 24 hour  Intake 336 ml  Output 2625 ml  Net -2289 ml   Filed Weights   06/05/19 2000 06/07/19 0509  Weight: 81.6 kg 87.6 kg    Examination:  Gen: Awake alert oriented to self only, does not communicate much HEENT:  PERRLA, Neck supple, no JVD Lungs: Decreased breath sounds at both bases CVS: RRR,No Gallops,Rubs or new Murmurs Abd: soft, Non tender, non distended, BS present Extremities: No edema Skin: no new rashes Central nervous system:  Right hemiplegia, somnolent today, unable to assess orientation Psychiatry: Poor insight and judgment    Data Reviewed:   CBC: Recent Labs  Lab 06/05/19 1210 06/07/19 0821 06/08/19 0534 06/09/19 0448  WBC 9.6 8.0 8.0 7.9  NEUTROABS 7.2  --   --   --   HGB 11.9* 12.0* 11.7* 11.5*  HCT 36.3* 35.1* 34.4* 33.8*  MCV 90.1 85.4 86.0 86.4  PLT 245 215 218 217   Basic Metabolic Panel: Recent Labs  Lab 06/05/19 1210 06/06/19 0212 06/07/19 0424 06/07/19 0821 06/08/19 0534 06/09/19 0448  NA 141  --   --  140 139 139  K 3.3*  --   --  2.8* 2.9* 3.1*  CL 110  --   --  100 99 101  CO2 23  --   --  27 27 28   GLUCOSE 131*  --   --  121* 110* 108*  BUN 18  --   --  18 24* 22  CREATININE 1.30* 1.28* 1.52* 1.52* 1.75* 1.66*  CALCIUM 9.0  --   --  9.1 9.0 8.9  MG  --  1.8  --   --   --   --    GFR: Estimated Creatinine Clearance: 33.7 mL/min (A) (by C-G formula based on SCr of 1.66 mg/dL (H)). Liver Function Tests: Recent Labs  Lab 06/05/19 1210  AST 20  ALT 7  ALKPHOS 62  BILITOT 0.6  PROT 6.5  ALBUMIN 3.1*   Recent Labs  Lab 06/05/19 1210  LIPASE 41   Recent Labs  Lab 06/05/19 1240  AMMONIA 20   Coagulation Profile: Recent Labs  Lab 06/05/19 1210  INR 1.1   Cardiac Enzymes: No results for input(s): CKTOTAL, CKMB, CKMBINDEX, TROPONINI in the last 168 hours. BNP (last 3 results) No results for input(s): PROBNP in the last 8760 hours. HbA1C: No results for input(s): HGBA1C in the last 72 hours. CBG: No results for input(s): GLUCAP in the last 168 hours. Lipid Profile: No results for input(s): CHOL, HDL, LDLCALC, TRIG, CHOLHDL, LDLDIRECT in the last 72 hours. Thyroid Function Tests: No results for input(s): TSH, T4TOTAL, FREET4, T3FREE, THYROIDAB in the last 72 hours. Anemia Panel: No results for input(s): VITAMINB12, FOLATE, FERRITIN, TIBC, IRON, RETICCTPCT in the last 72 hours. Urine analysis:    Component Value Date/Time   COLORURINE STRAW (A)  06/05/2019 1410   APPEARANCEUR HAZY (A) 06/05/2019 1410   LABSPEC 1.010 06/05/2019 1410   PHURINE 6.0 06/05/2019 1410   GLUCOSEU NEGATIVE 06/05/2019 1410   HGBUR SMALL (A) 06/05/2019 1410   BILIRUBINUR NEGATIVE 06/05/2019 1410   KETONESUR NEGATIVE 06/05/2019 1410   PROTEINUR NEGATIVE 06/05/2019 1410   NITRITE NEGATIVE 06/05/2019 1410   LEUKOCYTESUR NEGATIVE 06/05/2019 1410   Sepsis Labs: @LABRCNTIP (procalcitonin:4,lacticidven:4)  ) Recent Results (from the past 240 hour(s))  Blood culture (routine x 2)     Status: None (Preliminary result)   Collection Time: 06/05/19 12:30 PM   Specimen: BLOOD LEFT HAND  Result Value Ref Range Status   Specimen Description BLOOD LEFT HAND  Final   Special Requests   Final    BOTTLES DRAWN AEROBIC AND ANAEROBIC Blood Culture adequate volume   Culture   Final    NO GROWTH 4 DAYS Performed at [facility],  [address] [place], [place] [address]    Report Status PENDING  Incomplete  SARS Coronavirus 2 [facility](Hospital order, Performed in [facility] hospital lab) Nasopharyngeal Nasopharyngeal Swab     Status: None   Collection Time: 06/05/19 12:30 PM   Specimen: Nasopharyngeal Swab  Result Value Ref Range Status   SARS Coronavirus 2 NEGATIVE NEGATIVE Final    Comment: (NOTE) If result is NEGATIVE SARS-CoV-2 target nucleic acids are NOT DETECTED. The SARS-CoV-2 RNA is generally detectable in upper and lower  respiratory specimens during the acute phase of infection. The lowest  concentration of SARS-CoV-2 viral copies this assay can detect is 250  copies / mL. A negative result does not preclude SARS-CoV-2 infection  and should not be used as the sole basis for treatment or other  patient management decisions.  A negative result may occur with  improper specimen collection / handling, submission of specimen other  than nasopharyngeal swab, presence of viral mutation(s) within the  areas targeted by this assay, and inadequate number of viral  copies  (<250 copies / mL). A negative result must be combined with clinical  observations, patient history, and epidemiological information. If result is POSITIVE SARS-CoV-2 target nucleic acids are DETECTED. The SARS-CoV-2 RNA is generally detectable in upper and lower  respiratory specimens dur ing the acute phase of infection.  Positive  results are indicative of active infection with SARS-CoV-2.  Clinical  correlation with patient history and other diagnostic information is  necessary to determine patient infection status.  Positive results do  not rule out bacterial infection or co-infection with other viruses. If result is PRESUMPTIVE POSTIVE SARS-CoV-2 nucleic acids MAY BE PRESENT.   A presumptive positive result was obtained on the submitted specimen  and confirmed on repeat testing.  While 2019 novel coronavirus  (SARS-CoV-2) nucleic acids may be present in the submitted sample  additional confirmatory testing may be necessary for epidemiological  and / or clinical management purposes  to differentiate between  SARS-CoV-2 and other Sarbecovirus currently known to infect humans.  If clinically indicated additional testing with an alternate test  methodology [phone]([id number]) is advised. The SARS-CoV-2 RNA is generally  detectable in upper and lower respiratory sp ecimens during the acute  phase of infection. The expected result is Negative. Fact Sheet for Patients:  BoilerBrush.com.cyhttps://www.fda.gov/media/136312/download Fact Sheet for Healthcare Providers: https://pope.com/https://www.fda.gov/media/136313/download This test is not yet approved or cleared by the [place] FDA and has been authorized for detection and/or diagnosis of SARS-CoV-2 by FDA under an Emergency Use Authorization (EUA).  This EUA will remain in effect (meaning this test can be used) for the duration of the COVID-19 declaration under Section 564(b)(1) of the Act, 21 U.S.C. section 360bbb-3(b)(1), unless the authorization is terminated  or revoked sooner. Performed at [facility], [address] [place], [place] [address]   Blood culture (routine x 2)     Status: None (Preliminary result)   Collection Time: 06/05/19 12:40 PM   Specimen: BLOOD  Result Value Ref Range Status   Specimen Description BLOOD RIGHT ANTECUBITAL  Final   Special Requests   Final    BOTTLES DRAWN AEROBIC AND ANAEROBIC Blood Culture adequate volume   Culture   Final    NO GROWTH 4 DAYS Performed at [facility], [address] [place], [place] [address]    Report Status PENDING  Incomplete  Urine culture     Status: Abnormal   Collection Time: 06/05/19  2:08 PM  Specimen: Urine, Catheterized  Result Value Ref Range Status   Specimen Description URINE, CATHETERIZED  Final   Special Requests   Final    NONE Performed at [facility], [address] [place], [place] [address]    Culture MULTIPLE SPECIES PRESENT, SUGGEST RECOLLECTION (A)  Final   Report Status 06/06/2019 FINAL  Final  MRSA PCR Screening     Status: None   Collection Time: 06/06/19  6:36 AM   Specimen: Nasopharyngeal  Result Value Ref Range Status   MRSA by PCR NEGATIVE NEGATIVE Final    Comment:        The GeneXpert MRSA Assay (FDA approved for NASAL specimens only), is one component of a comprehensive MRSA colonization surveillance program. It is not intended to diagnose MRSA infection nor to guide or monitor treatment for MRSA infections. Performed at [facility], [address] [place], [place] [address]          Radiology Studies: No results found.      Scheduled Meds: . amLODipine  10 mg Oral Daily  . amoxicillin-clavulanate  1 tablet Oral Q12H  . aspirin EC  81 mg Oral Daily  . carbidopa-levodopa  1 tablet Oral TID  . cholecalciferol  1,000 Units Oral Daily  . clopidogrel  75 mg Oral Daily  . enoxaparin (LOVENOX) injection  40 mg Subcutaneous Q24H  . finasteride  5 mg Oral Daily  . food thickener  1 Container  Oral TID  . furosemide  40 mg Oral Daily  . loratadine  10 mg Oral Daily  . pantoprazole  40 mg Oral Daily  . potassium chloride  40 mEq Oral TID  . traZODone  25 mg Oral QHS   Continuous Infusions:    LOS: 4 days    Time spent: 25min    Zannie CovePreetha Xia Stohr, MD [facility] 06/09/2019, 1:05 PM

## 2019-06-10 LAB — CULTURE, BLOOD (ROUTINE X 2)
Culture: NO GROWTH
Culture: NO GROWTH
Special Requests: ADEQUATE
Special Requests: ADEQUATE

## 2019-06-10 LAB — BASIC METABOLIC PANEL
Anion gap: 11 (ref 5–15)
BUN: 20 mg/dL (ref 8–23)
CO2: 26 mmol/L (ref 22–32)
Calcium: 9 mg/dL (ref 8.9–10.3)
Chloride: 101 mmol/L (ref 98–111)
Creatinine, Ser: 1.43 mg/dL — ABNORMAL HIGH (ref 0.61–1.24)
GFR calc Af Amer: 51 mL/min — ABNORMAL LOW (ref >60)
GFR calc non Af Amer: 44 mL/min — ABNORMAL LOW (ref >60)
Glucose, Bld: 126 mg/dL — ABNORMAL HIGH (ref 70–99)
Potassium: 4.1 mmol/L (ref 3.5–5.1)
Sodium: 138 mmol/L (ref 135–145)

## 2019-06-10 LAB — SARS CORONAVIRUS 2 BY RT PCR (HOSPITAL ORDER, PERFORMED IN ~~LOC~~ HOSPITAL LAB): SARS Coronavirus 2: NEGATIVE

## 2019-06-10 MED ORDER — AMOXICILLIN-POT CLAVULANATE 875-125 MG PO TABS: 1.0000 | ORAL_TABLET | Freq: Two times a day (BID) | ORAL | 0 refills | Status: DC

## 2019-06-10 MED ORDER — POTASSIUM CHLORIDE CRYS ER 20 MEQ PO TBCR: 20.0000 meq | EXTENDED_RELEASE_TABLET | Freq: Two times a day (BID) | ORAL | 0 refills | Status: AC

## 2019-06-10 MED ORDER — FUROSEMIDE 40 MG PO TABS: 40.0000 mg | ORAL_TABLET | Freq: Every day | ORAL | 0 refills | Status: AC

## 2019-06-10 MED ORDER — POTASSIUM CHLORIDE CRYS ER 20 MEQ PO TBCR
40.0000 meq | EXTENDED_RELEASE_TABLET | Freq: Every day | ORAL | Status: DC
  Administered 2019-06-10 – 2019-06-12 (×3): 40 meq via ORAL
  Filled 2019-06-10 (×3): qty 2

## 2019-06-10 MED ORDER — AMOXICILLIN-POT CLAVULANATE 400-57 MG/5ML PO SUSR: 800.0000 mg | Freq: Two times a day (BID) | ORAL | 0 refills | Status: AC

## 2019-06-10 MED ORDER — AMOXICILLIN-POT CLAVULANATE 400-57 MG/5ML PO SUSR
800.0000 mg | Freq: Two times a day (BID) | ORAL | Status: AC
  Administered 2019-06-10 – 2019-06-12 (×4): 800 mg via ORAL
  Filled 2019-06-10 (×4): qty 10

## 2019-06-10 NOTE — Social Work (Signed)
Pt does NOT have a court appointed legal guardian. Confirmed with daughter Arbie Cookey on 8/11. Will deactivate banner at this time.  Westley Hummer, MSW, [place] Work [phone]

## 2019-06-10 NOTE — [place] FL2 (Addendum)
[place] MEDICAID FL2 LEVEL OF CARE SCREENING TOOL     IDENTIFICATION  Patient Name: Frederick Decker Birthdate: 01/15/1931 Sex: male Admission Date (Current Location): 06/05/2019  [facility] [place]   Facility and Address:  The . [facility], [address], [place], [place] [address]      Provider Number: [id number]  Attending Physician Name and Address:  Zannie CoveJoseph, Preetha, MD  Relative Name and Phone Number:  Okey RegalCarol; daughter; [phone]    Current Level of Care: Hospital Recommended Level of Care: Skilled Nursing Facility Prior Approval Number:    Date Approved/Denied:   PASRR Number: [id number] (under [place])  Discharge Plan: SNF    Current Diagnoses: Patient Active Problem List   Diagnosis Date Noted  . Pressure injury of skin 06/06/2019  . Acute respiratory failure with hypoxia (HCC) 06/05/2019  . Acute on chronic diastolic CHF (congestive heart failure) (HCC) 06/05/2019  . Aspiration pneumonia (HCC) 06/05/2019  . HTN (hypertension) 06/05/2019  . GERD (gastroesophageal reflux disease) 06/05/2019  . Hypokalemia 06/05/2019  . Liver lesion 06/05/2019  . Elevated troponin 06/05/2019  . CKD (chronic kidney disease), stage III (HCC) 06/05/2019  . Parkinson disease (HCC)   . Stroke (HCC) 09/05/2018  . Dementia (HCC) 2019    Orientation RESPIRATION BLADDER Height & Weight     Self  Normal Incontinent, External catheter Weight: 193 lb 2 oz (87.6 kg) Height:  5\' 6"  (167.6 cm)  BEHAVIORAL SYMPTOMS/MOOD NEUROLOGICAL BOWEL NUTRITION STATUS      Continent Diet(dysphagia 3; thin liquids)  AMBULATORY STATUS COMMUNICATION OF NEEDS Skin   Extensive Assist Non-Verbally Skin abrasions, Other (Comment)(deep tissue injury on heel and toe; abrasion on right hip; ecchymosis on bilateral arms)                       Personal Care Assistance Level of Assistance  Bathing, Feeding, Dressing Bathing Assistance: Maximum  assistance Feeding assistance: Limited assistance Dressing Assistance: Maximum assistance     Functional Limitations Info  Speech, Hearing, Sight Sight Info: Adequate Hearing Info: Adequate Speech Info: Impaired(expressive aphasia)    SPECIAL CARE FACTORS FREQUENCY  OT (By licensed OT), PT (By licensed PT)     PT Frequency: 5x week OT Frequency: 5x week            Contractures Contractures Info: Not present    Additional Factors Info  Code Status, Allergies, Psychotropic Code Status Info: Full Code Allergies Info: No Known Allergies Psychotropic Info: traZODone (DESYREL) tablet 25 mg daily at bedtime         Current Medications (06/10/2019):  This is the current hospital active medication list Current Facility-Administered Medications  Medication Dose Route Frequency Provider Last Rate Last Dose  . albuterol (PROVENTIL) (2.5 MG/3ML) 0.083% nebulizer solution 2.5 mg  2.5 mg Nebulization Q4H PRN Lorretta HarpNiu, Xilin, MD      . amLODipine (NORVASC) tablet 10 mg  10 mg Oral Daily Lorretta HarpNiu, Xilin, MD   10 mg at 06/10/19 1118  . amoxicillin-clavulanate (AUGMENTIN) 400-57 MG/5ML suspension 800 mg  800 mg Oral Q12H Zannie CoveJoseph, Preetha, MD      . aspirin EC tablet 81 mg  81 mg Oral Daily Lorretta HarpNiu, Xilin, MD   81 mg at 06/10/19 1118  . carbidopa-levodopa (SINEMET IR) 25-100 MG per tablet immediate release 1 tablet  1 tablet Oral TID Lorretta HarpNiu, Xilin, MD   1 tablet at 06/10/19 1119  . cholecalciferol (VITAMIN D3) tablet 1,000 Units  1,000 Units Oral Daily  Ivor Costa, MD   1,000 Units at 06/10/19 1118  . clopidogrel (PLAVIX) tablet 75 mg  75 mg Oral Daily Ivor Costa, MD   75 mg at 06/10/19 1119  . dextromethorphan-guaiFENesin (MUCINEX DM) 30-600 MG per 12 hr tablet 1 tablet  1 tablet Oral BID PRN Ivor Costa, MD      . enoxaparin (LOVENOX) injection 40 mg  40 mg Subcutaneous Q24H Ivor Costa, MD   40 mg at 06/09/19 2121  . finasteride (PROSCAR) tablet 5 mg  5 mg Oral Daily Ivor Costa, MD   5 mg at 06/10/19 1119  .  food thickener (THICK IT) powder 1 Container  1 Container Oral TID Ivor Costa, MD   1 Container at 06/08/19 2121  . furosemide (LASIX) tablet 40 mg  40 mg Oral Daily Domenic Polite, MD   40 mg at 06/10/19 1119  . ipratropium (ATROVENT) nebulizer solution 0.5 mg  0.5 mg Nebulization Q6H PRN Domenic Polite, MD      . loratadine (CLARITIN) tablet 10 mg  10 mg Oral Daily Ivor Costa, MD   10 mg at 06/10/19 1118  . pantoprazole (PROTONIX) EC tablet 40 mg  40 mg Oral Daily Ivor Costa, MD   40 mg at 06/10/19 1118  . traZODone (DESYREL) tablet 25 mg  25 mg Oral QHS Ivor Costa, MD   25 mg at 06/09/19 2122     Discharge Medications: Please see discharge summary for a list of discharge medications.  Relevant Imaging Results:  Relevant Lab Results:   Additional Information [id number] [place], [place]

## 2019-06-10 NOTE — Discharge Summary (Addendum)
Physician Discharge Summary  Frederick Decker TDV:[id number] DOB: 1931/04/06 DOA: 06/05/2019  PCP: System, Pcp Not In  Admit date: 06/05/2019 Discharge date: 06/12/2019  Time spent: 35 minutes  Recommendations for Outpatient Follow-up:  1. PCP in 1 week, please check bmet at follow-up 2. Please ensure palliative care follow-up at assisted living, continue goals of care discussions 3. Nonspecific liver lesion incidentally noted on imaging,, consider reevaluation as outpatient with liver ultrasound  Discharge Diagnoses:  Principal Problem:   Acute respiratory failure with hypoxia (HCC) Aspiration pneumonia   Acute on chronic diastolic CHF (congestive heart failure) (HCC)   Parkinson disease (HCC)   Stroke (HCC)   Dementia (HCC) Mild dysphagia   Aspiration pneumonia (HCC)   HTN (hypertension)   GERD (gastroesophageal reflux disease)   Hypokalemia   Liver lesion   Elevated troponin   CKD (chronic kidney disease), stage III (HCC)   Pressure injury of skin   Discharge Condition: Stable  Diet recommendation: Dysphagia 3, mechanical soft diet with thin liquids  Filed Weights   06/05/19 2000 06/07/19 0509  Weight: 81.6 kg 87.6 kg    History of present illness:  Frederick Tweedyis a 85 y.o.malewith medical history history of dementia, stroke with right-sided weakness, Parkinson's disease, chronic diastolic CHF, stage III chronic kidney disease presented to the emergency room with shortness of breath cough. -Patient at baseline requires assistance in most ADLs, is not able to ambulate without assistance, says only a few words like yes or no. -Family reports that he has gone downhill since his stroke in November. -Now admitted with aspiration pneumonia and acute on chronic diastolic CHF    Hospital Course:   Acute respiratory failure with hypoxia ([place]): -Secondary to aspiration pneumonia and acute on chronic diastolic CHF -Clinically improving on IV Zosyn, changed to Augmentin to  complete 7-day course  -Volume status much improved as well -Blood cultures negative thus far -Therapy evaluations completed -Discharge back to SNF today -Needs palliative care follow-up due to advanced age, debility, Parkinson's disease, dementia, prior stroke, dysphagia and ongoing risk of aspiration  Aspiration pneumonia -Suspect he has dysphagia from Parkinson's disease, advanced age, prior CVA -SLP evaluation completed, mild oropharyngeal dysphagia noted, dysphagia 3 diet with thin liquids recommended -Improving, see discussion above, transition to oral Augmentin   Acute on chronic diastolic CHF (congestive heart failure) ([place]): -2D echo 09/06/2018 showed EF 60-65%. -Diuresed with IV Lasix with good effect, he is negative 8 L  -repeat echo noted preserved EF, diastolic dysfunction -Volume status much improved -Transition to oral Lasix 40 mg daily, continue potassium 20 mEq twice daily, check B met in 1 week  Elevated troponin:Trop 22. Likely due to demand ischemia secondary to hypoxia and CHF exacerbation -No further work-up indicated for this, patient denies any chest pain  Chronic kidney disease stage III -Baseline creatinine around 1.5 -Stable  Hypokalemia -From diuretics, replaced  Parkinson disease ([place]): -Continue Sinemet  Stroke [facility]): -Continue aspirin, Plavix  Dementia ([place]): -At baseline can say yes or no and occasionally a sentence, intermittent confusion at baseline -Requires assistance in most ADLs  Essential hypertension: -Stable, amlodipine  GERD (gastroesophageal reflux disease): -Protonix  Hypokalemia: -Repleted, continue at discharge due to ongoing need for diuretics  Liver lesion: CAT showedIndeterminate 2.2 cm hypoattenuating lesion in liver. Incompletely characterized given streak artifact in single phase of imaging. -need to f/u with PCP  Code Status: DNR, discussed with daughter   Discharge Exam: Vitals:   06/09/19  2300 06/10/19 0733  BP: 138/67 (!) 145/90  Pulse: 78 72  Resp: 14 17  Temp: 98.7 F (37.1 C) 98.7 F (37.1 C)  SpO2: 98% 95%    Examination: General exam: Appears calm and comfortable    Discharge Instructions   Discharge Instructions    Discharge instructions   Complete by: As directed    Dysphagia 3, mechanical soft diet with thin liquids   Increase activity slowly   Complete by: As directed      Allergies as of 06/10/2019   No Known Allergies     Medication List    STOP taking these medications   bumetanide 0.5 MG tablet Commonly known as: BUMEX   doxycycline 100 MG tablet Commonly known as: VIBRA-TABS     TAKE these medications   acetaminophen 325 MG tablet Commonly known as: TYLENOL Take 650 mg by mouth every 6 (six) hours as needed.   albuterol (2.5 MG/3ML) 0.083% nebulizer solution Commonly known as: PROVENTIL Inhale 2.5 mLs into the lungs every 4 (four) hours as needed.   amLODipine 10 MG tablet Commonly known as: NORVASC Take 10 mg by mouth daily.   amoxicillin-clavulanate 875-125 MG tablet Commonly known as: AUGMENTIN Take 1 tablet by mouth every 12 (twelve) hours for 2 days.   aspirin EC 81 MG tablet Take 81 mg by mouth daily.   carbidopa-levodopa 25-100 MG tablet Commonly known as: SINEMET IR Take 1 tablet by mouth 3 (three) times daily.   cetirizine 10 MG tablet Commonly known as: ZYRTEC Take 10 mg by mouth daily.   clopidogrel 75 MG tablet Commonly known as: PLAVIX Take 75 mg by mouth daily.   D 1000 25 MCG (1000 UT) capsule Generic drug: Cholecalciferol Take 1,000 Units by mouth daily.   dimethicone-zinc oxide cream Apply 1 application topically 2 (two) times daily as needed (skin Incontinence care).   Ensure Take 237 mLs by mouth daily. Keep in the frig   finasteride 5 MG tablet Commonly known as: PROSCAR Take 5 mg by mouth daily.   food thickener Powd Commonly known as: THICK IT Take 1 Container by mouth 3 (three)  times daily. With liquids   furosemide 40 MG tablet Commonly known as: LASIX Take 1 tablet (40 mg total) by mouth daily.   omeprazole 20 MG capsule Commonly known as: PRILOSEC Take 20 mg by mouth daily.   potassium chloride SA 20 MEQ tablet Commonly known as: K-DUR Take 1 tablet (20 mEq total) by mouth 2 (two) times daily. What changed:   medication strength  how much to take   traZODone 50 MG tablet Commonly known as: DESYREL Take 25 mg by mouth at bedtime.   VITAMIN B 12 PO Take 1,000 mcg by mouth daily.      No Known Allergies Follow-up Information    PCP. Schedule an appointment as soon as possible for a visit in 1 week(s).   Why: please check Bmet in 1 week ALF needs to arrange Palliative care Follow up           The results of significant diagnostics from this hospitalization (including imaging, microbiology, ancillary and laboratory) are listed below for reference.    Significant Diagnostic Studies: Ct Head Wo Contrast  Result Date: 06/05/2019 CLINICAL DATA:  Altered mental status. EXAM: CT HEAD WITHOUT CONTRAST TECHNIQUE: Contiguous axial images were obtained from the base of the skull through the vertex without intravenous contrast. COMPARISON:  Head CT and MRI 09/05/2018 FINDINGS: Brain: There is no evidence of acute large territory infarct, intracranial hemorrhage, mass, midline shift,  or extra-axial fluid collection. Confluent cerebral white matter hypodensities are similar to the prior CT and nonspecific but compatible with severe chronic small vessel ischemic disease. Moderate ventriculomegaly is unchanged and is again noted to be out of proportion to the degree of sulcal enlargement with gyral crowding at the vertex. A chronic distal left ACA infarct is noted in the posterior left frontal lobe. There are chronic lacunar infarcts in the thalami. Heterogeneous hypoattenuation is also present in the basal ganglia bilaterally with numerous dilated perivascular  spaces shown on MRI. There is a small chronic left cerebellar infarct which is unchanged. Vascular: Calcified atherosclerosis at the skull base. No hyperdense vessel. Skull: No acute fracture or focal osseous lesion. Sinuses/Orbits: No acute findings.  Bilateral cataract extraction. Other: None. IMPRESSION: 1. No evidence of acute intracranial abnormality. 2. Severe chronic small vessel ischemic disease with multiple chronic infarcts as above. 3. Unchanged ventriculomegaly which may reflect central predominant cerebral atrophy or normal pressure hydrocephalus in the appropriate clinical setting. Electronically Signed   By: Logan Bores M.D.   On: 06/05/2019 15:33   Ct Angio Chest Pe W And/or Wo Contrast  Result Date: 06/05/2019 CLINICAL DATA:  PE suspected, high pretest probability, presents with ascites and swelling to legs, new for this patient. Recent pneumonia currently receiving antibiotic therapy. EXAM: CT ANGIOGRAPHY CHEST WITH CONTRAST TECHNIQUE: Multidetector CT imaging of the chest was performed using the standard protocol during bolus administration of intravenous contrast. Multiplanar CT image reconstructions and MIPs were obtained to evaluate the vascular anatomy. CONTRAST:  31m OMNIPAQUE IOHEXOL 350 MG/ML SOLN COMPARISON:  Chest radiograph same day FINDINGS: Cardiovascular: Satisfactory opacification of the pulmonary arteries to the segmental level. Extensive respiratory motion artifact may limit detection of distal segmental and subsegmental pulmonary emboli. Detection is further complicated by extensive streak artifact from patient's thoracic hardware. No evidence of large central or segmental pulmonary embolus. The heart is enlarged with extensive coronary artery calcification, mitral annular calcification and calcifications upon the aortic leaflets. No pericardial effusion. Mediastinum/Nodes: No enlarged mediastinal, hilar, or axillary lymph nodes. Thyroid gland, trachea, and esophagus  demonstrate no significant findings. Lungs/Pleura: Dependent atelectasis is seen posteriorly. Few areas of patchy ground-glass opacity are present in the right middle lobe, lingula and lung bases which may reflect infectious or inflammatory sequela from known pneumonia. More consolidative opacity is present in the right posterior costophrenic sulcus. There is abundant subpleural fat. No pneumothorax. No visible effusion. Upper Abdomen: 2.2 cm hypoattenuating lesion is seen in segment 4 of the liver, incompletely assessed on this exam due to the single phase of contrast extensive streak artifact from the patient's thoracolumbar hardware. No acute abnormalities present in the visualized portions of the upper abdomen. Musculoskeletal: Cervicothoracic and thoracolumbar fusion hardware is present within the spine there is evidence of adjacent segment disease near the terminus of both fixations. Findings on a background of more diffuse degenerative disease. Mild bilateral gynecomastia is present. No acute or suspicious osseous lesion or soft tissue abnormality is seen. Review of the MIP images confirms the above findings. IMPRESSION: 1. Extensive respiratory motion artifact may limit detection of distal segmental and subsegmental pulmonary emboli. No evidence of large central or segmental pulmonary embolus. 2. Few areas of patchy ground-glass opacity are present in the right middle lobe, lingula and lower lobes with more consolidative opacity in the right posterior costophrenic sulcus may reflect infectious or inflammatory sequela from known pneumonia and/or features of atelectasis. 3. Cardiomegaly with extensive coronary artery calcification, mitral annular calcification, and calcifications  upon the aortic leaflets. 4. Aortic Atherosclerosis (ICD10-I70.0). 5. Indeterminate 2.2 cm hypoattenuating lesion in liver. Incompletely characterized given streak artifact in single phase of imaging. Could be further evaluated with  sonography on an outpatient basis. Electronically Signed   By: Lovena Le M.D.   On: 06/05/2019 19:14   Dg Chest Portable 1 View  Result Date: 06/05/2019 CLINICAL DATA:  Shortness of breath, hypoxia EXAM: PORTABLE CHEST 1 VIEW COMPARISON:  None. FINDINGS: No consolidation, features of edema, pneumothorax, or effusion. Chronic elevation the right hemidiaphragm pulmonary vascularity is normally distributed. Calcified tortuous aorta the cardiomediastinal contours are otherwise unremarkable. No acute osseous or soft tissue abnormality. Degenerative changes are present in the and imaged spine and shoulders. Partially imaged cervical and thoracolumbar fusion hardware without visible complication. Surgical material projects in the right upper quadrant. IMPRESSION: No acute cardiopulmonary abnormality Electronically Signed   By: Lovena Le M.D.   On: 06/05/2019 17:15   Dg Swallowing Func-speech Pathology  Result Date: 06/06/2019 Objective Swallowing Evaluation: Type of Study: MBS-Modified Barium Swallow Study  Patient Details Name: Nichalas Coin MRN: [id number] Date of Birth: 10/02/1931 Today's Date: 06/06/2019 Time: SLP Start Time (ACUTE ONLY): 1300 -SLP Stop Time (ACUTE ONLY): 1320 SLP Time Calculation (min) (ACUTE ONLY): 20 min Past Medical History: Past Medical History: Diagnosis Date . CHF (congestive heart failure) ([place]) 2019 . Dementia ([place]) 2019 . Parkinson disease ([place])  . Stroke [facility]) 09/05/2018 Past Surgical History: Past Surgical History: Procedure Laterality Date . BACK SURGERY   . REPLACEMENT TOTAL KNEE BILATERAL   HPI: Mr Kaio Kuhlman, 87y/m presented to ED with shortness of breath, dry cough, mild fever and chills. He was treated for possible pneumonia 7/31. PMH significant for dementia, hypertension, stroke with right-sided weakness, dCHF, Parkinson's disease, CKD stage III, GERD, prior CVA. No prior history of swallowing problems.  Subjective: alert and cooperative Assessment / Plan / Recommendation  CHL IP CLINICAL IMPRESSIONS 06/06/2019 Clinical Impression Patient has mild oropharyngeal dysphagia. Oral manipulation and mastication are reduced with solids and a delayed swallow to the valleculae sinuses with all consistencies. There was no laryngeal penetration or aspiration across consistencies tested. He requires additional time for each swallow, but is able to protect his airway well. Recommend Dys 3, thin liquids. Medications to be given whole in puree. ST to follow up for diet tolerance and aspiration precaution training.  SLP Visit Diagnosis Dysphagia, oropharyngeal phase (R13.12) Attention and concentration deficit following -- Frontal lobe and executive function deficit following -- Impact on safety and function Mild aspiration risk   CHL IP TREATMENT RECOMMENDATION 06/06/2019 Treatment Recommendations Therapy as outlined in treatment plan below   Prognosis 06/06/2019 Prognosis for Safe Diet Advancement Good Barriers to Reach Goals Cognitive deficits Barriers/Prognosis Comment -- CHL IP DIET RECOMMENDATION 06/06/2019 SLP Diet Recommendations Dysphagia 3 (Mech soft) solids;Thin liquid Liquid Administration via Cup Medication Administration Whole meds with puree Compensations Minimize environmental distractions;Slow rate;Small sips/bites;Follow solids with liquid Postural Changes Seated upright at 90 degrees;Remain semi-upright after after feeds/meals (Comment)   CHL IP OTHER RECOMMENDATIONS 06/06/2019 Recommended Consults -- Oral Care Recommendations Oral care BID Other Recommendations --   CHL IP FOLLOW UP RECOMMENDATIONS 06/06/2019 Follow up Recommendations Skilled Nursing facility   [facility] IP FREQUENCY AND DURATION 06/06/2019 Speech Therapy Frequency (ACUTE ONLY) min 1 x/week Treatment Duration 2 weeks      CHL IP ORAL PHASE 06/06/2019 Oral Phase Impaired Oral - Pudding Teaspoon Delayed oral transit Oral - Pudding Cup -- Oral - Honey Teaspoon -- Oral - Honey  Cup -- Oral - Nectar Teaspoon -- Oral - Nectar Cup -- Oral -  Nectar Straw -- Oral - Thin Teaspoon -- Oral - Thin Cup -- Oral - Thin Straw -- Oral - Puree -- Oral - Mech Soft Delayed oral transit;Lingual pumping Oral - Regular -- Oral - Multi-Consistency -- Oral - Pill -- Oral Phase - Comment --  CHL IP PHARYNGEAL PHASE 06/06/2019 Pharyngeal Phase Impaired Pharyngeal- Pudding Teaspoon -- Pharyngeal -- Pharyngeal- Pudding Cup -- Pharyngeal -- Pharyngeal- Honey Teaspoon -- Pharyngeal -- Pharyngeal- Honey Cup -- Pharyngeal -- Pharyngeal- Nectar Teaspoon -- Pharyngeal -- Pharyngeal- Nectar Cup -- Pharyngeal -- Pharyngeal- Nectar Straw -- Pharyngeal -- Pharyngeal- Thin Teaspoon -- Pharyngeal -- Pharyngeal- Thin Cup Delayed swallow initiation-vallecula Pharyngeal -- Pharyngeal- Thin Straw -- Pharyngeal -- Pharyngeal- Puree -- Pharyngeal -- Pharyngeal- Mechanical Soft -- Pharyngeal -- Pharyngeal- Regular -- Pharyngeal -- Pharyngeal- Multi-consistency -- Pharyngeal -- Pharyngeal- Pill -- Pharyngeal -- Pharyngeal Comment --  CHL IP CERVICAL ESOPHAGEAL PHASE 06/06/2019 Cervical Esophageal Phase WFL Pudding Teaspoon -- Pudding Cup -- Honey Teaspoon -- Honey Cup -- Nectar Teaspoon -- Nectar Cup -- Nectar Straw -- Thin Teaspoon -- Thin Cup -- Thin Straw -- Puree -- Mechanical Soft -- Regular -- Multi-consistency -- Pill -- Cervical Esophageal Comment -- Charlynne Cousins Ward, MA, CCC-SLP 06/06/2019 1:50 PM               Microbiology: Recent Results (from the past 240 hour(s))  Blood culture (routine x 2)     Status: None (Preliminary result)   Collection Time: 06/05/19 12:30 PM   Specimen: BLOOD LEFT HAND  Result Value Ref Range Status   Specimen Description BLOOD LEFT HAND  Final   Special Requests   Final    BOTTLES DRAWN AEROBIC AND ANAEROBIC Blood Culture adequate volume   Culture   Final    NO GROWTH 4 DAYS Performed at [facility], [address], [place], [place] [address]    Report Status PENDING  Incomplete  SARS Coronavirus 2 [facility] order, Performed in [facility]  hospital lab) Nasopharyngeal Nasopharyngeal Swab     Status: None   Collection Time: 06/05/19 12:30 PM   Specimen: Nasopharyngeal Swab  Result Value Ref Range Status   SARS Coronavirus 2 NEGATIVE NEGATIVE Final    Comment: (NOTE) If result is NEGATIVE SARS-CoV-2 target nucleic acids are NOT DETECTED. The SARS-CoV-2 RNA is generally detectable in upper and lower  respiratory specimens during the acute phase of infection. The lowest  concentration of SARS-CoV-2 viral copies this assay can detect is 250  copies / mL. A negative result does not preclude SARS-CoV-2 infection  and should not be used as the sole basis for treatment or other  patient management decisions.  A negative result may occur with  improper specimen collection / handling, submission of specimen other  than nasopharyngeal swab, presence of viral mutation(s) within the  areas targeted by this assay, and inadequate number of viral copies  (<250 copies / mL). A negative result must be combined with clinical  observations, patient history, and epidemiological information. If result is POSITIVE SARS-CoV-2 target nucleic acids are DETECTED. The SARS-CoV-2 RNA is generally detectable in upper and lower  respiratory specimens dur ing the acute phase of infection.  Positive  results are indicative of active infection with SARS-CoV-2.  Clinical  correlation with patient history and other diagnostic information is  necessary to determine patient infection status.  Positive results do  not rule out bacterial infection or  co-infection with other viruses. If result is PRESUMPTIVE POSTIVE SARS-CoV-2 nucleic acids MAY BE PRESENT.   A presumptive positive result was obtained on the submitted specimen  and confirmed on repeat testing.  While 2019 novel coronavirus  (SARS-CoV-2) nucleic acids may be present in the submitted sample  additional confirmatory testing may be necessary for epidemiological  and / or clinical management  purposes  to differentiate between  SARS-CoV-2 and other Sarbecovirus currently known to infect humans.  If clinically indicated additional testing with an alternate test  methodology [phone]) is advised. The SARS-CoV-2 RNA is generally  detectable in upper and lower respiratory sp ecimens during the acute  phase of infection. The expected result is Negative. Fact Sheet for Patients:  StrictlyIdeas.no Fact Sheet for Healthcare Providers: BankingDealers.co.za This test is not yet approved or cleared by the [place] FDA and has been authorized for detection and/or diagnosis of SARS-CoV-2 by FDA under an Emergency Use Authorization (EUA).  This EUA will remain in effect (meaning this test can be used) for the duration of the COVID-19 declaration under Section 564(b)(1) of the Act, 21 U.S.C. section 360bbb-3(b)(1), unless the authorization is terminated or revoked sooner. Performed at [facility], [place] [address] [place], [place] [address]   Blood culture (routine x 2)     Status: None (Preliminary result)   Collection Time: 06/05/19 12:40 PM   Specimen: BLOOD  Result Value Ref Range Status   Specimen Description BLOOD RIGHT ANTECUBITAL  Final   Special Requests   Final    BOTTLES DRAWN AEROBIC AND ANAEROBIC Blood Culture adequate volume   Culture   Final    NO GROWTH 4 DAYS Performed at [facility], [place] [address], [place], [place] [address]    Report Status PENDING  Incomplete  Urine culture     Status: Abnormal   Collection Time: 06/05/19  2:08 PM   Specimen: Urine, Catheterized  Result Value Ref Range Status   Specimen Description URINE, CATHETERIZED  Final   Special Requests   Final    NONE Performed at [facility], [address], [place], [place] [address]    Culture MULTIPLE SPECIES PRESENT, SUGGEST RECOLLECTION (A)  Final   Report Status 06/06/2019 FINAL  Final  MRSA PCR Screening     Status:  None   Collection Time: 06/06/19  6:36 AM   Specimen: Nasopharyngeal  Result Value Ref Range Status   MRSA by PCR NEGATIVE NEGATIVE Final    Comment:        The GeneXpert MRSA Assay (FDA approved for NASAL specimens only), is one component of a comprehensive MRSA colonization surveillance program. It is not intended to diagnose MRSA infection nor to guide or monitor treatment for MRSA infections. Performed at [facility], [place] [address] [place], [place] [address]      Labs: Basic Metabolic Panel: Recent Labs  Lab 06/05/19 1210 06/06/19 0865 06/07/19 0424 06/07/19 7846 06/08/19 0534 06/09/19 0448 06/10/19 0437  NA 141  --   --  140 139 139 138  K 3.3*  --   --  2.8* 2.9* 3.1* 4.1  CL 110  --   --  100 99 101 101  CO2 23  --   --  27 27 28 26   GLUCOSE 131*  --   --  121* 110* 108* 126*  BUN 18  --   --  18 24* 22 20  CREATININE 1.30* 1.28* 1.52* 1.52* 1.75* 1.66*  1.43*  CALCIUM 9.0  --   --  9.1 9.0 8.9 9.0  MG  --  1.8  --   --   --   --   --    Liver Function Tests: Recent Labs  Lab 06/05/19 1210  AST 20  ALT 7  ALKPHOS 62  BILITOT 0.6  PROT 6.5  ALBUMIN 3.1*   Recent Labs  Lab 06/05/19 1210  LIPASE 41   Recent Labs  Lab 06/05/19 1240  AMMONIA 20   CBC: Recent Labs  Lab 06/05/19 1210 06/07/19 0821 06/08/19 0534 06/09/19 0448  WBC 9.6 8.0 8.0 7.9  NEUTROABS 7.2  --   --   --   HGB 11.9* 12.0* 11.7* 11.5*  HCT 36.3* 35.1* 34.4* 33.8*  MCV 90.1 85.4 86.0 86.4  PLT 245 215 218 217   Cardiac Enzymes: No results for input(s): CKTOTAL, CKMB, CKMBINDEX, TROPONINI in the last 168 hours. BNP: BNP (last 3 results) Recent Labs    03/27/19 2333 06/05/19 1240  BNP 101.9* 110.5*    ProBNP (last 3 results) No results for input(s): PROBNP in the last 8760 hours.  CBG: No results for input(s): GLUCAP in the last 168 hours.     Signed:  Domenic Polite MD.  [facility] 06/10/2019, 11:55 AM

## 2019-06-10 NOTE — TOC Progression Note (Addendum)
Transition of Care [facility]) - Progression Note    Patient Details  Name: Frederick Decker MRN: [id number] Date of Birth: 1931-08-11  Transition of Care [facility]) CM/SW [place], [place] Phone Number: 06/10/2019, 12:42 PM  Clinical Narrative:    1:12pm- CSW spoke with pt daughter, she provided her email for offers when available. [email or web address]   12:42pm- Pt ALF is declining to take pt back citing inability to provide level of assistance needed at this time. CSW has sent out referrals for SNF; will provide list to pt daughter and requested a new COVID screen from pt MD due to new disposition.     Expected Discharge Plan: Assisted Living Barriers to Discharge: Continued Medical Work up  Expected Discharge Plan and Services Expected Discharge Plan: Assisted Living In-house Referral: Clinical Social Work Discharge Planning Services: CM Consult Post Acute Care Choice: [place], [place] Living arrangements for the past 2 months: [facility] Expected Discharge Date: 06/10/19                     Social Determinants of Health (SDOH) Interventions    Readmission Risk Interventions No flowsheet data found.

## 2019-06-10 NOTE — Care Management (Addendum)
CM [place] covering pt remainder portion of the day. Per daughter pt can not communicate verbally due to previous stroke - pt defer discussions to daughters Arbie [place] and [place].  Per CSW request - CM emailed and reviewed list with daughter of available SNF  bed offers - daughter to follow back up with TOC.  COVID test still not resulted.   CM received call back from daughter Cathie Beams - family interested in [facility] and [place].  CM explained that both facilities have not accepted pt as of yet - noted as pending.  CM reached out to both facilities ; [facility] liaison is off today and CM left CM for [employer].  CM explained to daughter that choice will need to be made from accepted bed offers - discharge can not be delayed based on preference of non accepting facilites.  CM encouraged daughter to research facilities that have accepted pt per email.    [facility] called back; they can only accept pt under insurance for remainder of IV antibiotic days and then after that pt will have to private pay - CM informed daughter - daughters will review accepted bed offers

## 2019-06-10 NOTE — Progress Notes (Signed)
Physical Therapy Treatment Patient Details Name: Frederick Decker MRN: [id number] DOB: [birth year] Today's Date: 06/10/2019    History of Present Illness [name], 87y/m presented to ED with shortness of breath, dry cough, mild fever and chills. Dx:  acute respiratory failure with hypoxia, aspiration PNA, acute on chronic diastolic CHF. PMH significant for dementia, hypertension, stroke with right-sided weakness, dCHF, Parkinson's disease, CKD stage III, GERD, prior CVA. No prior history of swallowing problems.      PT Comments     Continuing work on functional mobility and activity tolerance;  Max to total assist for all movement today; Assisted pt with cleanup and hygeine after a BM; then sat EOB for approx 5-10 minutes with mod/max assist for balance; unable to attempt sit to stand due to strong posterior bias; Finished session in bed with bed in semi-chair position; Daughter, Frederick Decker, in room  Follow Up Recommendations  Home health PT;Other (comment)(at [facility] that manages high level of care.)  Noted plan for DC to SNF     Equipment Recommendations  None recommended by PT    Recommendations for Other Services       Precautions / Restrictions Precautions Precautions: Fall    Mobility  Bed Mobility Overal bed mobility: Needs Assistance Bed Mobility: Rolling;Sidelying to Sit;Sit to Supine Rolling: Max assist Sidelying to sit: +2 for physical assistance;Total assist   Sit to supine: Total assist   General bed mobility comments: Pt will assist with attempting to lift trunk   Transfers Overall transfer level: Needs assistance   Transfers: Sit to/from Stand Sit to Stand: +2 physical assistance;Total assist         General transfer comment: Tried simple sit to stand with this therapist and rehab tech, and as we leaned in for more support, he pushed back into a heavy posterior lean; we were unable to attempt  Ambulation/Gait                 Stairs              Wheelchair Mobility    Modified Rankin (Stroke Patients Only)       Balance     Sitting balance-Leahy Scale: Poor Sitting balance - Comments: Pt sat EOB x ~15 mins initially with heavy posterior lean.  he will initiate pulling forward and able to facilitate anterior translation of trunk during reach activities.  He was able to sit with min guard assist x ~ 3 mins, otherwise fluctuated between min - max A    Postural control: Posterior lean                                  Cognition Arousal/Alertness: Awake/alert Behavior During Therapy: Flat affect;WFL for tasks assessed/performed Overall Cognitive Status: History of cognitive impairments - at baseline                                 General Comments: Pt smiled to therapist, but other times flat affect.  He waved appropriately to say "good bye".  He did not attempt to speak, but did initiate reaching for object x 2 when prompted to do so - did not attempt to follow any other commands       Exercises      General Comments General comments (skin integrity, edema, etc.): Pt did move his bowels during session; total assist to  clean up      Pertinent Vitals/Pain Pain Assessment: Faces Faces Pain Scale: No hurt    Home Living                      Prior Function            PT Goals (current goals can now be found in the care plan section) Acute Rehab PT Goals Patient Stated Goal: Family goal: get some therapy to decrease burden of care PT Goal Formulation: With patient/family Time For Goal Achievement: 06/21/19 Potential to Achieve Goals: Fair Progress towards PT goals: Progressing toward goals(slowly)    Frequency    Min 3X/week      PT Plan Current plan remains appropriate    Co-evaluation              AM-PAC PT "6 Clicks" Mobility   Outcome Measure  Help needed turning from your back to your side while in a flat bed without using bedrails?: Total Help  needed moving from lying on your back to sitting on the side of a flat bed without using bedrails?: Total Help needed moving to and from a bed to a chair (including a wheelchair)?: Total Help needed standing up from a chair using your arms (e.g., wheelchair or bedside chair)?: Total Help needed to walk in hospital room?: Total Help needed climbing 3-5 steps with a railing? : Total 6 Click Score: 6    End of Session Equipment Utilized During Treatment: Gait belt Activity Tolerance: Patient tolerated treatment well Patient left: in bed;with call bell/phone within reach;Other (comment)(bed in semi-chair position) Nurse Communication: Mobility status;Need for lift equipment PT Visit Diagnosis: Other abnormalities of gait and mobility (R26.89);Muscle weakness (generalized) (M62.81)     Time: [id number] PT Time Calculation (min) (ACUTE ONLY): 28 min  Charges:  $Therapeutic Activity: 23-37 mins                     Van ClinesHolly Caniya Tagle, PT  Acute Rehabilitation Services Pager [phone] Office [phone]    Levi AlandHolly H Sharmayne Jablon 06/10/2019, 1:13 PM

## 2019-06-10 NOTE — TOC Initial Note (Addendum)
Transition of Care [facility](TOC) - Initial/Assessment Note    Patient Details  Name: Frederick Decker MRN: [id number] Date of Birth: Feb 13, 1931  Transition of Care [facility](TOC) CM/SW Contact:    Frederick HutchingIsabel H Barnes Decker, LCSWA Phone Number: 06/10/2019, 10:04 AM  Clinical Narrative:                11:17am- CSW sent referral and touched base again with pt daughter. At this time we discussed [facility] may not be able to meet all of Frederick Decker's needs. Pt daughter states understanding, we discussed SNF referral as a back up measure in case ALF states they cannot take him back. She is amenable, pt was in a SNF following his stroke back in November in [place]. CSW faxed out referral and will f/u once determination has been made by ALF about returning.   10:04am- CSW spoke with pt daughter via telephone. Introduced self, role, reason for call. [facility]. Pt had been getting therapies at the facility and daughter is hopeful that pt can return with those.   [facility] called [facility] and they need to review therapy notes before confirming he can return to ALF level; due to his significant assistance needs they feel he may be more appropriate for SNF level therapies at this time. CSW will fax the therapy evaluations to Frederick Decker at [phone].  Expected Discharge Plan: Assisted Living Barriers to Discharge: Continued Medical Work up   Patient Goals and CMS Choice Patient states their goals for this hospitalization and ongoing recovery are:: for him to return with OT and PT CMS Medicare.gov Compare Post Acute Care list provided to:: Patient Represenative (must comment)(pt daughter) Choice offered to / list presented to : Adult Children  Expected Discharge Plan and Services Expected Discharge Plan: Assisted Living In-house Referral: Clinical Social Work Discharge Planning Services: CM Consult Post Acute Care Choice: Home Health, Skilled  Nursing Facility Living arrangements for the past 2 months: Assisted Living Facility Expected Discharge Date: 06/10/19                Prior Living Arrangements/Services Living arrangements for the past 2 months: Assisted Living Facility Lives with:: Facility Resident Patient language and need for interpreter reviewed:: Yes(no needs) Do you feel safe going back to the place where you live?: Yes      Need for Family Participation in Patient Care: Yes (Comment)(decision making; assist with ADLs/IADLs) Care giver support system in place?: Yes (comment) Current home services: Home PT, DME Criminal Activity/Legal Involvement Pertinent to Current Situation/Hospitalization: No - Comment as needed  Activities of Daily Living Home Assistive Devices/Equipment: None ADL Screening (condition at time of admission) Patient's cognitive ability adequate to safely complete daily activities?: No Is the patient deaf or have difficulty hearing?: No Does the patient have difficulty seeing, even when wearing glasses/contacts?: No Does the patient have difficulty concentrating, remembering, or making decisions?: Yes Patient able to express need for assistance with ADLs?: No Does the patient have difficulty dressing or bathing?: Yes Independently performs ADLs?: No Communication: Dependent Is this a change from baseline?: Pre-admission baseline Dressing (OT): Dependent Is this a change from baseline?: Pre-admission baseline Grooming: Dependent Is this a change from baseline?: Pre-admission baseline Feeding: Dependent Is this a change from baseline?: Pre-admission baseline Bathing: Dependent Is this a change from baseline?: Pre-admission baseline Toileting: Dependent Is this a change from baseline?: Pre-admission baseline In/Out Bed: Dependent Is this a change from baseline?:  Pre-admission baseline Walks in Home: Dependent Is this a change from baseline?: Pre-admission baseline Does the patient have  difficulty walking or climbing stairs?: Yes Weakness of Legs: Both Weakness of Arms/Hands: Both  Permission Sought/Granted Permission sought to share information with : Facility Sport and exercise psychologist, Family Supports Permission granted to share information with : No(pt disoriented x4)  Share Information with NAME: Frederick Decker  Permission granted to share info w AGENCY: [employer] granted to share info w Relationship: daughter  Permission granted to share info w Contact Information: [phone]  Emotional Assessment Appearance:: Other (Comment Required(telephonic assessment with daughter) Attitude/Demeanor/Rapport: (telephonic assessment with daughter) Affect (typically observed): (telephonic assessment with daughter) Orientation: : Fluctuating Orientation (Suspected and/or reported Sundowners) Alcohol / Substance Use: Not Applicable Psych Involvement: No (comment)  Admission diagnosis:  Shortness of breath [R06.02] Peripheral edema [R60.9] Hypoxia [R09.02] Aspiration pneumonia ([place]) [J69.0] Patient Active Problem List   Diagnosis Date Noted  . Pressure injury of skin 06/06/2019  . Acute respiratory failure with hypoxia ([place]) 06/05/2019  . Acute on chronic diastolic CHF (congestive heart failure) ([place]) 06/05/2019  . Aspiration pneumonia ([place]) 06/05/2019  . HTN (hypertension) 06/05/2019  . GERD (gastroesophageal reflux disease) 06/05/2019  . Hypokalemia 06/05/2019  . Liver lesion 06/05/2019  . Elevated troponin 06/05/2019  . CKD (chronic kidney disease), stage III ([place]) 06/05/2019  . Parkinson disease ([place])   . Stroke ([place]) 09/05/2018  . Dementia ([place]) 2019   PCP:  System, Pcp Not In Pharmacy:  No Pharmacies Listed    Social Determinants of Health (SDOH) Interventions    Readmission Risk Interventions No flowsheet data found.

## 2019-06-11 DIAGNOSIS — J9601 Acute respiratory failure with hypoxia: Secondary | ICD-10-CM

## 2019-06-11 LAB — CREATININE, SERUM
Creatinine, Ser: 1.55 mg/dL — ABNORMAL HIGH (ref 0.61–1.24)
GFR calc Af Amer: 47 mL/min — ABNORMAL LOW (ref >60)
GFR calc non Af Amer: 40 mL/min — ABNORMAL LOW (ref >60)

## 2019-06-11 NOTE — TOC Progression Note (Signed)
Transition of Care (TOC) - Progression Note    Patient Details  Name: Frederick Decker MRN: [id number] Date of Birth: 10/12/1931  Transition of Care (TOC) CM/SW Contact   B , LCSWA Phone Number: 06/11/2019, 3:30 PM  Clinical Narrative:     CSW met with the patient's daughter at bedside. She chose [facility]. She inquired about a facility transfer to [facility] once a bed became available. CSW informed her to follow up with the social worker at [facility] and express her wishes. Patient's daughter would like to transition her father to assisted living at [facility] eventually.   CSW followed up with Tracy at [facility]. They have a bed available whenever he is medically stable enough to be discharged. CSW will continue to follow.     Expected Discharge Plan: Assisted Living Barriers to Discharge: Continued Medical Work up  Expected Discharge Plan and Services Expected Discharge Plan: Assisted Living In-house Referral: Clinical Social Work Discharge Planning Services: CM Consult Post Acute Care Choice: Home Health, Skilled Nursing Facility Living arrangements for the past 2 months: Assisted Living Facility Expected Discharge Date: 06/11/19                                     Social Determinants of Health (SDOH) Interventions    Readmission Risk Interventions No flowsheet data found.  

## 2019-06-11 NOTE — Progress Notes (Signed)
  PROGRESS NOTE  Patient was ready to be discharged to assisted living facility on 8/11. ALF had declined to take patient back and patient now needing SNF placement.  Discussed with daughter at bedside.  Repeat COVID screen negative 8/11.  Ready for discharge to SNF today.  See previous providers discharge summary from 8/11.   Dessa Phi, DO [facility] [email or web address] 06/11/2019, 11:13 AM

## 2019-06-11 NOTE — Plan of Care (Signed)
  Problem: Education: Goal: Knowledge of General Education information will improve Description Including pain rating scale, medication(s)/side effects and non-pharmacologic comfort measures Outcome: Progressing   

## 2019-06-12 LAB — CREATININE, SERUM
Creatinine, Ser: 1.47 mg/dL — ABNORMAL HIGH (ref 0.61–1.24)
GFR calc Af Amer: 50 mL/min — ABNORMAL LOW (ref >60)
GFR calc non Af Amer: 43 mL/min — ABNORMAL LOW (ref >60)

## 2019-06-12 NOTE — TOC Transition Note (Signed)
Transition of Care [facility]) - CM/SW Discharge Note *Discharged to [facility] via ambulance   Patient Details  Name: Kert Shackett MRN: [id number] Date of Birth: [birth year]  Transition of Care [facility]) CM/SW Contact:  Sable Feil, LCSW Phone Number: 06/12/2019, 7:16 PM   Clinical Narrative: Facility contacted regarding discharge and d/c clinicals transmitted to [facility]. Patient and daughter Cathie Beams advised re: discharge, and ambulance transport. Nurse provided with information to call report to facility.      Final next level of care: Skilled Nursing Facility Barriers to Discharge: No Barriers Identified   Patient Goals and CMS Choice Patient states their goals for this hospitalization and ongoing recovery are:: for him to return with OT and PT CMS Medicare.gov Compare Post Acute Care list provided to:: Patient Represenative (must comment)(Daughters) Choice offered to / list presented to : Adult Children  Discharge Placement   Existing PASRR number confirmed : 06/10/19          Patient chooses bed at: [facility] Patient to be transferred to facility by: Ambulance Name of family member notified: Daughter Cathie Beams at the bedside Patient and family notified of of transfer: 06/12/19  Discharge Plan and Services In-house Referral: Clinical Social Work Discharge Planning Services: [employer] Consult Post Acute Care Choice: Home Health, [place]                               Social Determinants of Health (SDOH) Interventions  No SDOH interventions needed prior to discharge.   Readmission Risk Interventions No flowsheet data found.

## 2019-06-12 NOTE — Progress Notes (Signed)
  PROGRESS NOTE  Patient seen this morning, daughter at bedside. No acute events overnight. Patient resting comfortably in bed. Daughter without acute hospital concerns today. Plan for discharge to SNF today. Discharge summary updated.    Dessa Phi, DO [email or web address] 06/12/2019, 9:56 AM

## 2020-12-30 IMAGING — DX PORTABLE CHEST - 1 VIEW
1 series · 1 of 1 positions shown · non-contrast
Comparison: None.

CLINICAL DATA: Cough and shortness of breath

EXAM:
PORTABLE CHEST 1 VIEW

[chest ap]
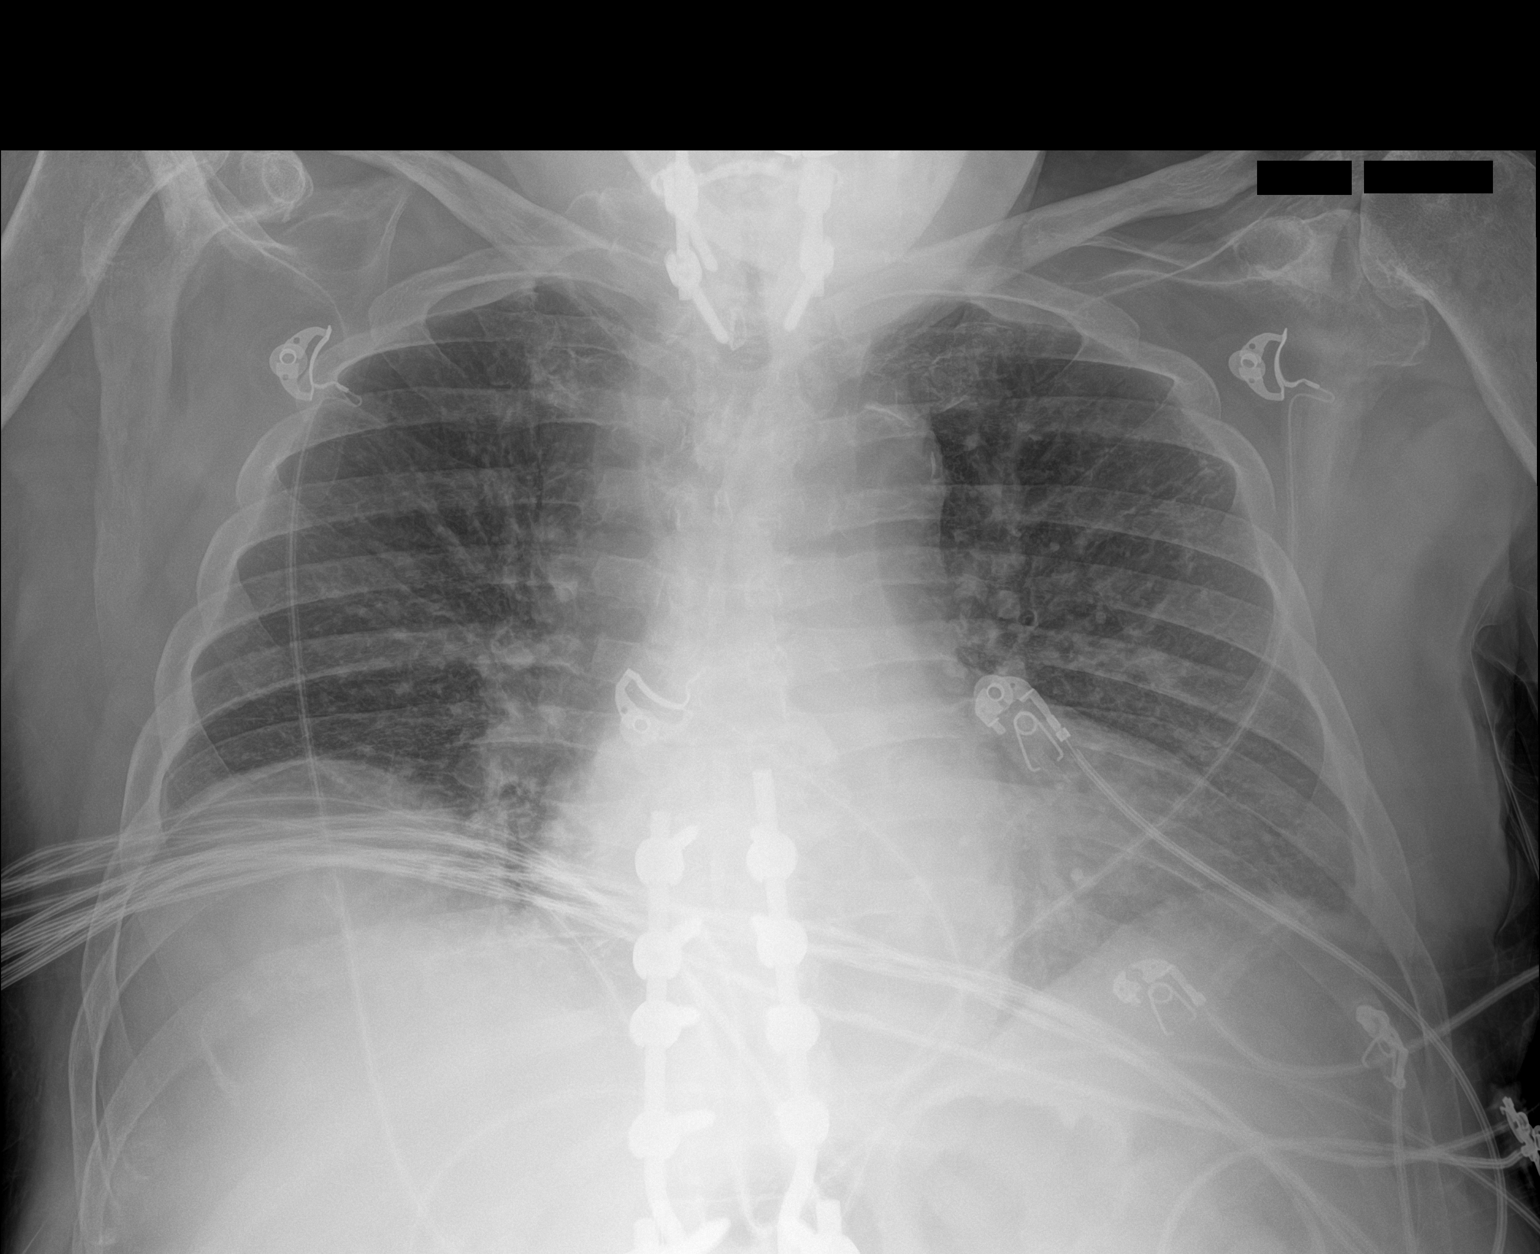

[1 of 1 positions shown; findings below may reference images not displayed]

FINDINGS: Cardiac shadow is mildly enlarged. Aortic calcifications are.
Postsurgical changes in cervical and thoracolumbar spine are noted.
The overall inspiratory effort is poor with crowding of vascular
markings. No acute bony abnormality is seen.
IMPRESSION: No active disease.

## 2021-03-09 IMAGING — DX PORTABLE CHEST - 1 VIEW
1 series · 1 of 1 positions shown · non-contrast
Comparison: None.

CLINICAL DATA: Shortness of breath, hypoxia

EXAM:
PORTABLE CHEST 1 VIEW

[chest ap]
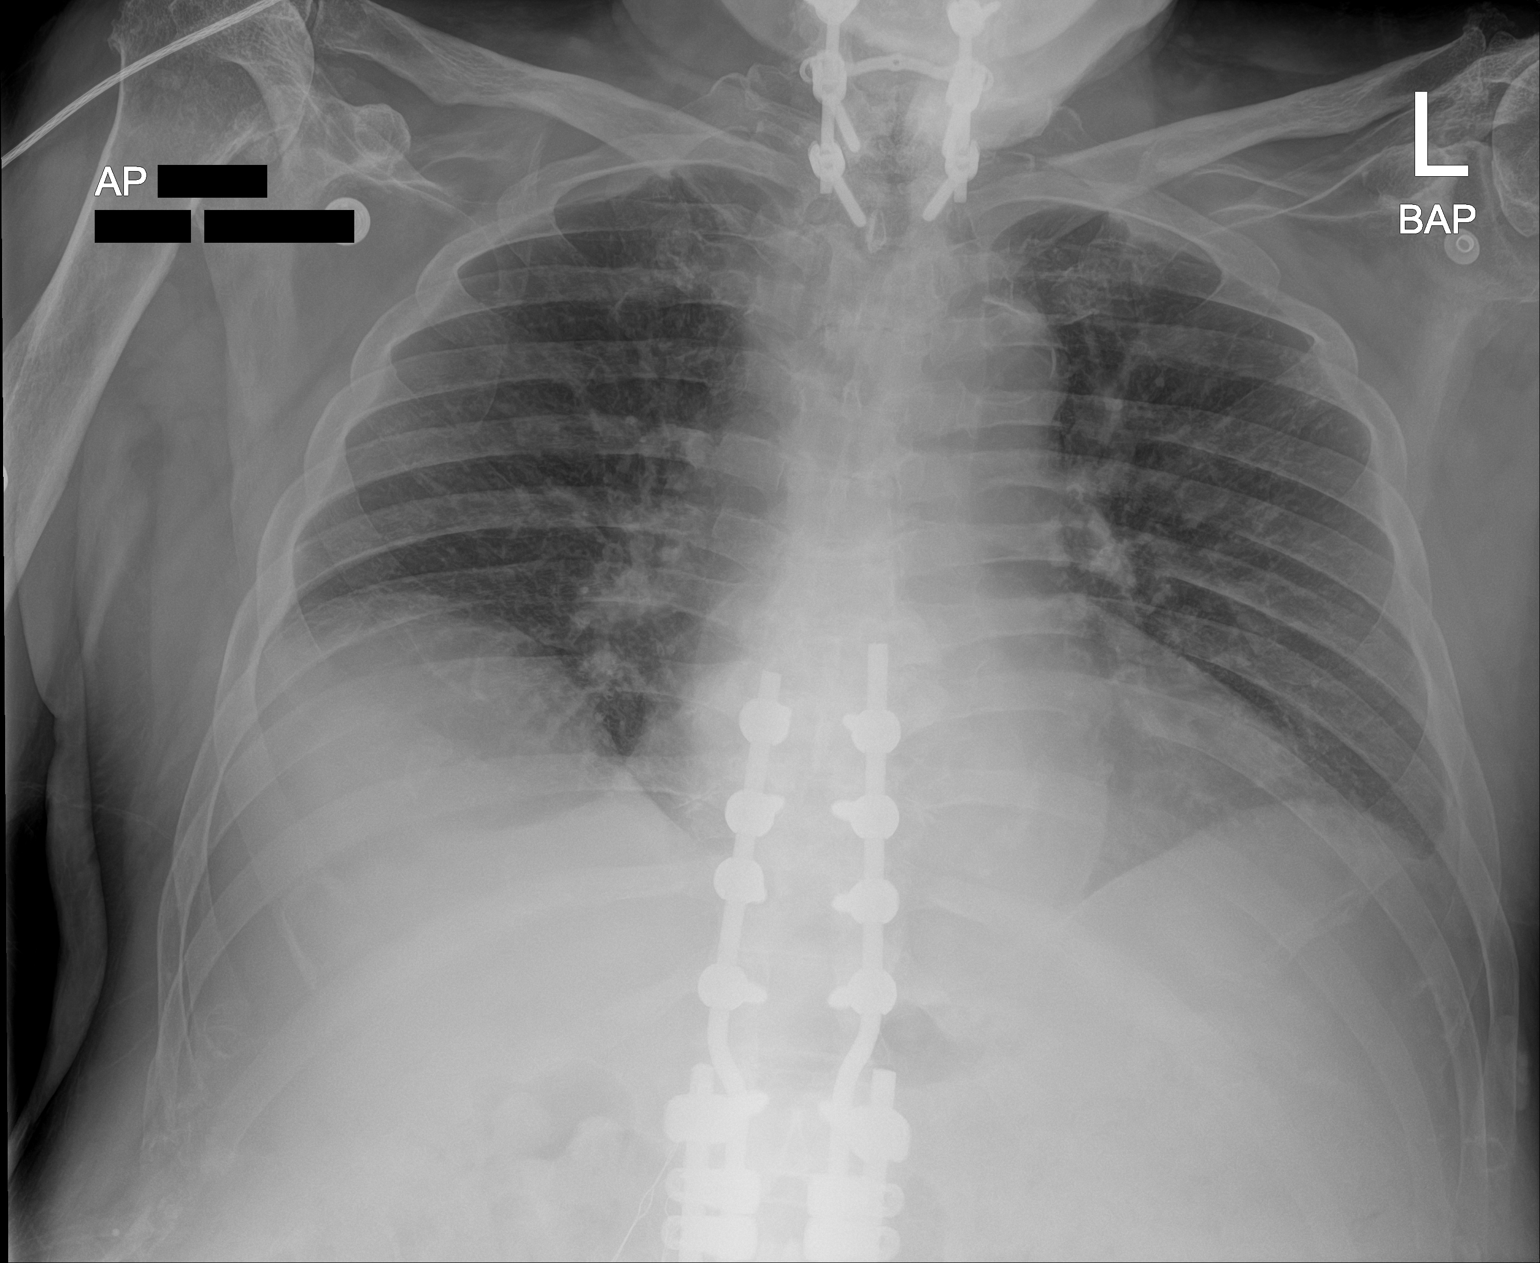

[1 of 1 positions shown; findings below may reference images not displayed]

FINDINGS: No consolidation, features of edema, pneumothorax, or effusion.
Chronic elevation the right hemidiaphragm pulmonary vascularity is
normally distributed. Calcified tortuous aorta the cardiomediastinal
contours are otherwise unremarkable. No acute osseous or soft tissue
abnormality. Degenerative changes are present in the and imaged
spine and shoulders. Partially imaged cervical and thoracolumbar
fusion hardware without visible complication. Surgical material
projects in the right upper quadrant.
IMPRESSION: No acute cardiopulmonary abnormality

## 2021-03-09 IMAGING — CT CT HEAD WITHOUT CONTRAST
4 series · 15 of 47 positions shown, 17 images · non-contrast
Comparison: Head CT and MRI 09/05/2018

CLINICAL DATA: Altered mental status.

EXAM:
CT HEAD WITHOUT CONTRAST
TECHNIQUE: Contiguous axial images were obtained from the base of the skull
through the vertex without intravenous contrast.

[Series 3: head wo · axial · 0.48mm/px · z∈[-172,-47]mm · 7 of 35 slices shown, 9 images]
[im 5/35  brain]
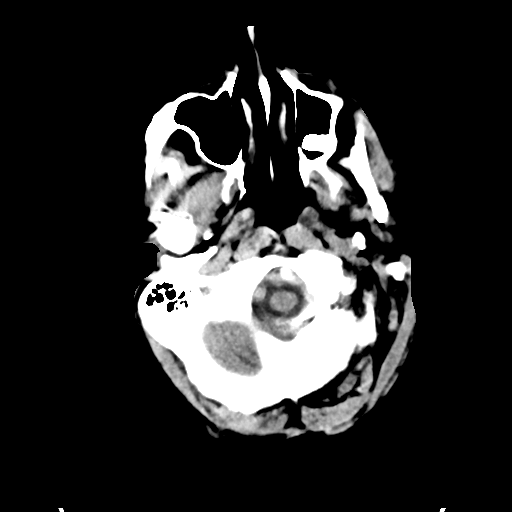
[im 5/35  bone]
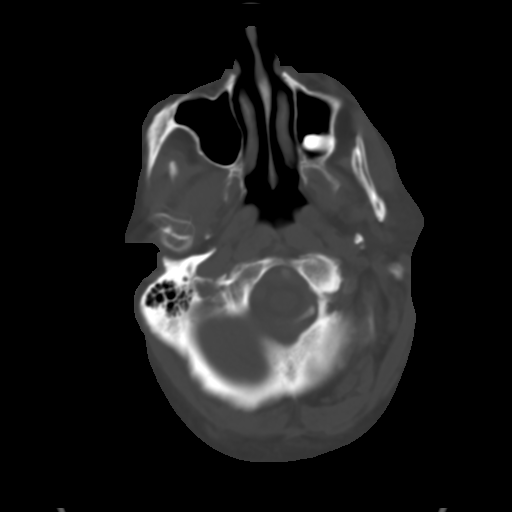
[im 9/35  brain]
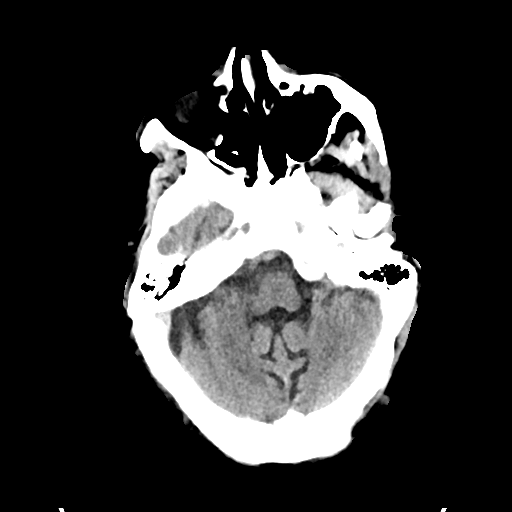
[im 13/35  brain]
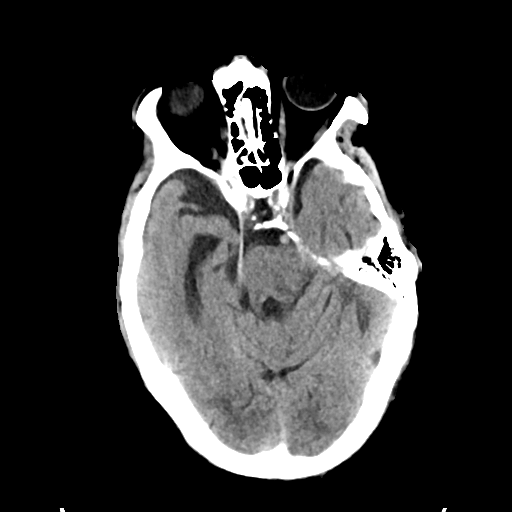
[im 18/35  brain]
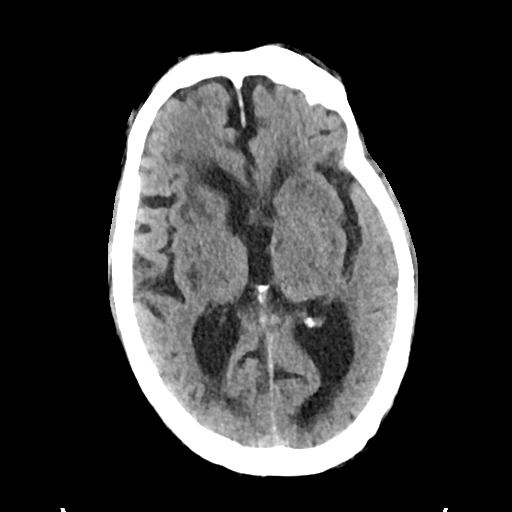
[im 22/35  brain]
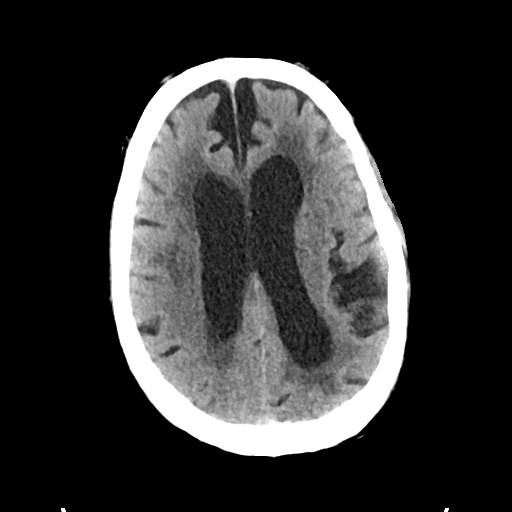
[im 22/35  bone]
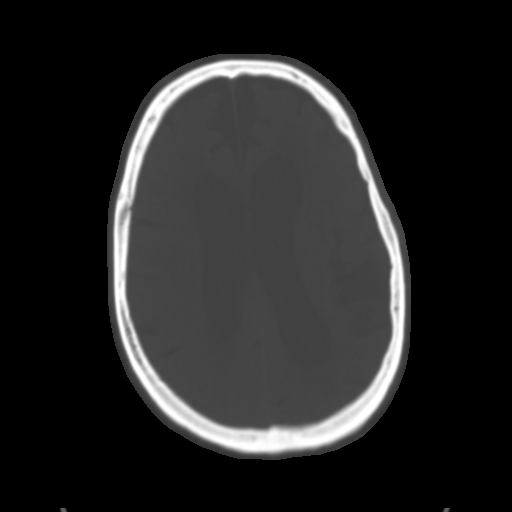
[im 26/35  brain]
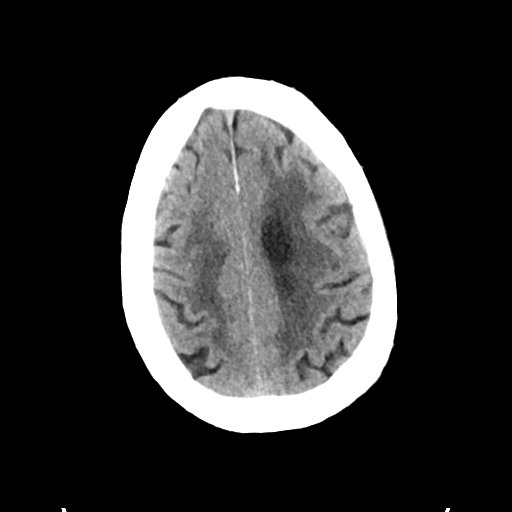
[im 30/35  brain]
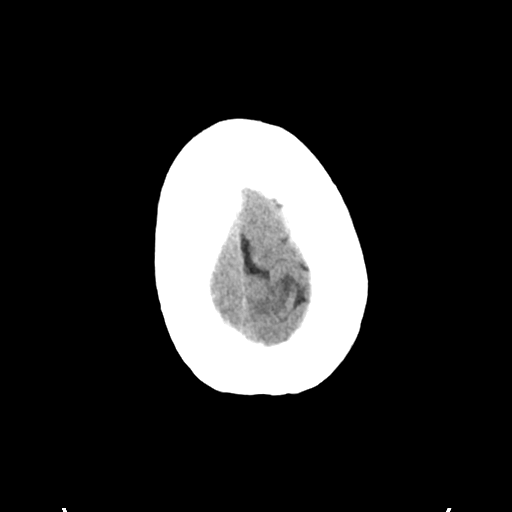

[Series 4: head bone · axial · 0.48mm/px · z∈[-176,-158]mm · 2 of 88 slices shown]
[im 9/88  bone]
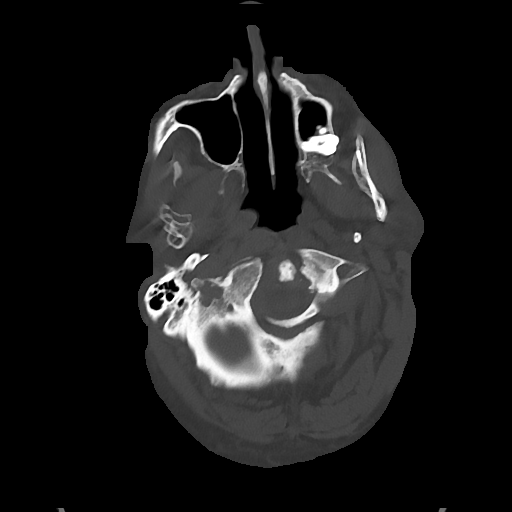
[im 18/88  bone]
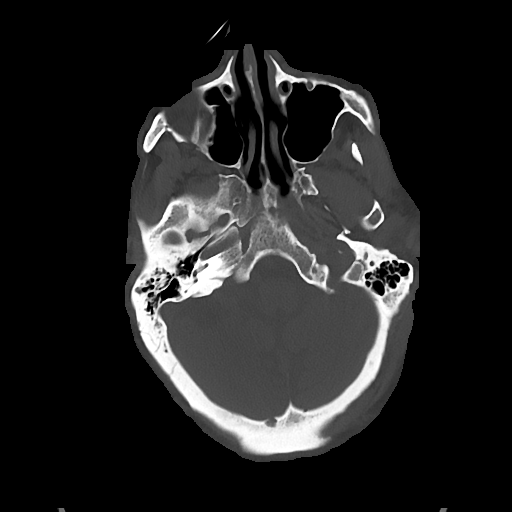

[Series 5: cor soft · coronal · 0.34mm/px · 3 of 76 slices shown]
[im 26/76  brain]
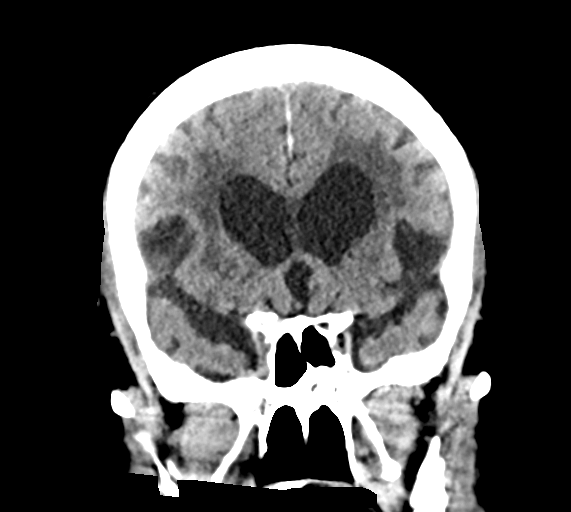
[im 34/76  brain]
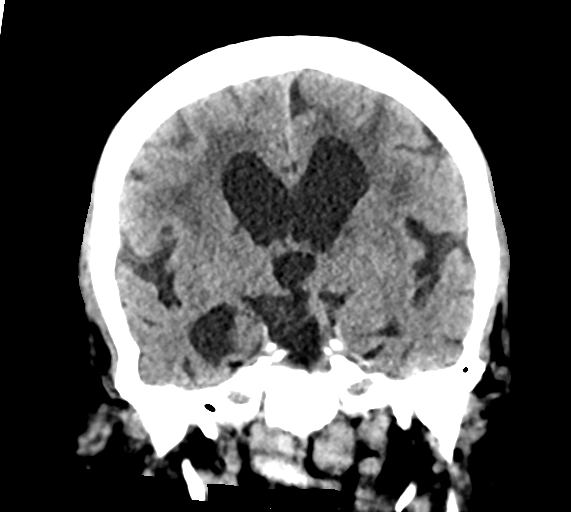
[im 42/76  brain]
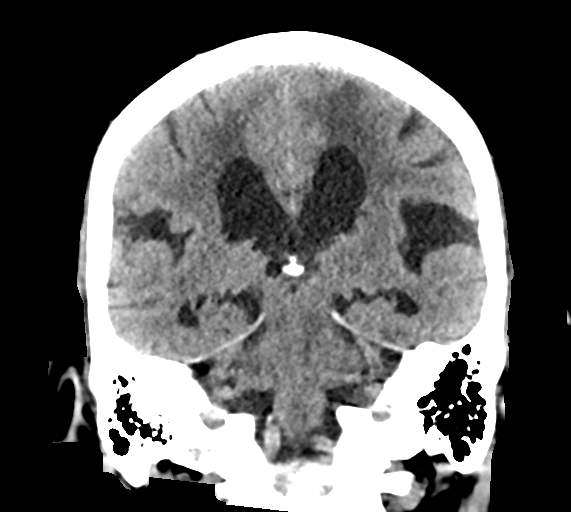

[Series 6: sag soft · sagittal · 0.34mm/px · 3 of 56 slices shown]
[im 20/56  brain]
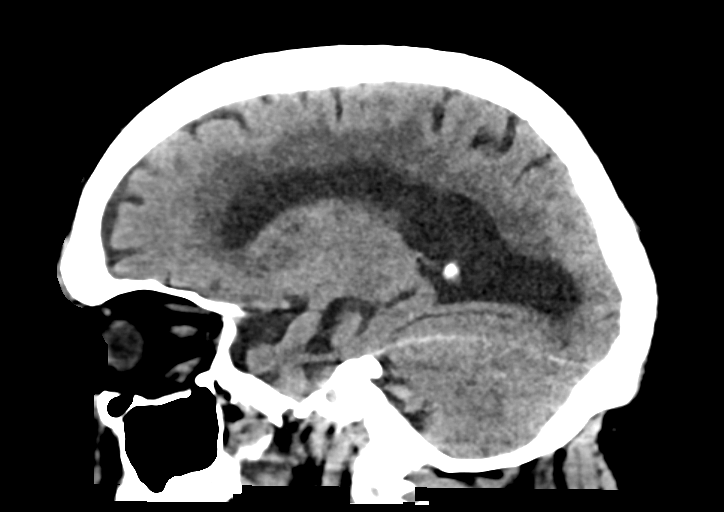
[im 28/56  brain]
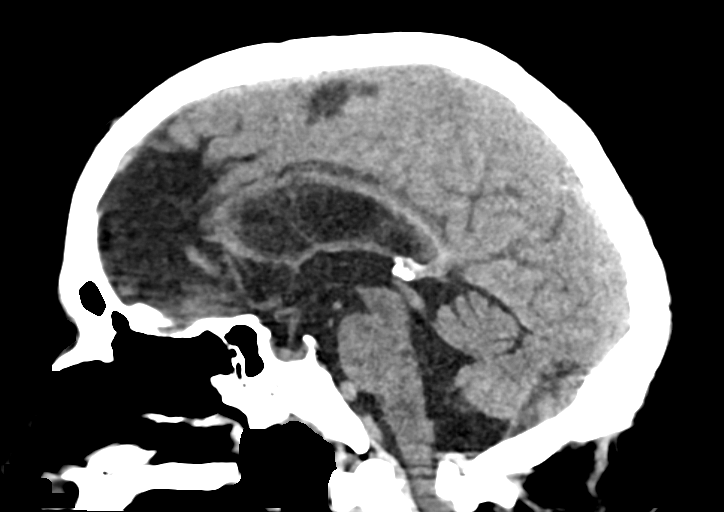
[im 36/56  brain]
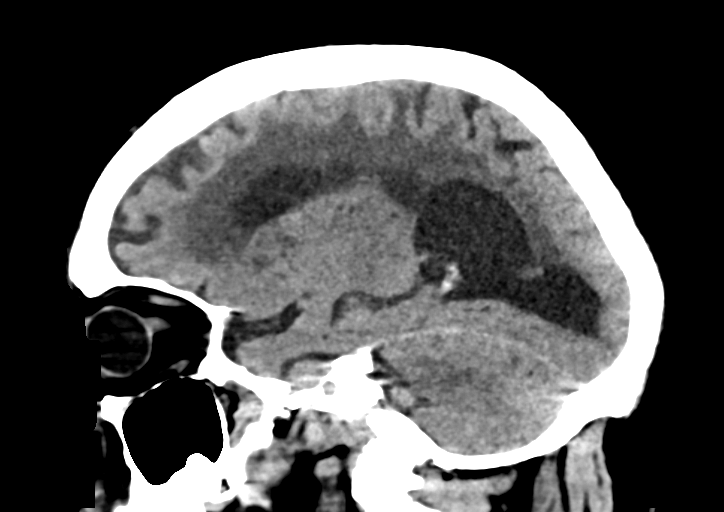

[15 of 47 positions shown; findings below may reference images not displayed]

FINDINGS: Brain: There is no evidence of acute large territory infarct,
intracranial hemorrhage, mass, midline shift, or extra-axial fluid
collection. Confluent cerebral white matter hypodensities are
similar to the prior CT and nonspecific but compatible with severe
chronic small vessel ischemic disease. Moderate ventriculomegaly is
unchanged and is again noted to be out of proportion to the degree
of sulcal enlargement with gyral crowding at the vertex. A chronic
distal left ACA infarct is noted in the posterior left frontal lobe.
There are chronic lacunar infarcts in the thalami. Heterogeneous
hypoattenuation is also present in the basal ganglia bilaterally
with numerous dilated perivascular spaces shown on MRI. There is a
small chronic left cerebellar infarct which is unchanged.

Vascular: Calcified atherosclerosis at the skull base. No hyperdense
vessel.

Skull: No acute fracture or focal osseous lesion.

Sinuses/Orbits: No acute findings.  Bilateral cataract extraction.

Other: None.
IMPRESSION: 1. No evidence of acute intracranial abnormality.
2. Severe chronic small vessel ischemic disease with multiple
chronic infarcts as above.
3. Unchanged ventriculomegaly which may reflect central predominant
cerebral atrophy or normal pressure hydrocephalus in the appropriate
clinical setting.

## 2021-08-30 DEATH — deceased
